# Patient Record
Sex: Female | Born: 1980 | Race: Black or African American | Hispanic: No | Marital: Single | Smoking: Former smoker
Health system: Southern US, Community
[De-identification: ages and names within clinical notes are randomized; demographics above are authoritative.]

## PROBLEM LIST (undated history)

## (undated) VITALS — BP 111/75 | HR 92 | Temp 97.6°F | Resp 16

## (undated) DIAGNOSIS — N390 Urinary tract infection, site not specified: Secondary | ICD-10-CM

## (undated) DIAGNOSIS — F419 Anxiety disorder, unspecified: Secondary | ICD-10-CM

## (undated) DIAGNOSIS — F329 Major depressive disorder, single episode, unspecified: Secondary | ICD-10-CM

## (undated) DIAGNOSIS — F209 Schizophrenia, unspecified: Secondary | ICD-10-CM

## (undated) DIAGNOSIS — F32A Depression, unspecified: Secondary | ICD-10-CM

## (undated) HISTORY — PX: TONSILLECTOMY: SUR1361

## (undated) HISTORY — PX: TUBAL LIGATION: SHX77

## (undated) HISTORY — PX: WISDOM TOOTH EXTRACTION: SHX21

---
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
## 2002-01-11 ENCOUNTER — Encounter: Payer: Self-pay | Admitting: Emergency Medicine

## 2002-01-11 ENCOUNTER — Emergency Department (HOSPITAL_COMMUNITY): Admission: EM | Admit: 2002-01-11 | Discharge: 2002-01-11 | Payer: Self-pay | Admitting: Emergency Medicine

## 2002-06-01 ENCOUNTER — Other Ambulatory Visit: Admission: RE | Admit: 2002-06-01 | Discharge: 2002-06-01 | Payer: Self-pay | Admitting: *Deleted

## 2004-12-24 ENCOUNTER — Ambulatory Visit (HOSPITAL_COMMUNITY): Admission: RE | Admit: 2004-12-24 | Discharge: 2004-12-24 | Payer: Self-pay | Admitting: Obstetrics & Gynecology

## 2005-02-20 ENCOUNTER — Inpatient Hospital Stay (HOSPITAL_COMMUNITY): Admission: AD | Admit: 2005-02-20 | Discharge: 2005-02-21 | Payer: Self-pay | Admitting: Family Medicine

## 2005-03-01 ENCOUNTER — Ambulatory Visit (HOSPITAL_COMMUNITY): Admission: RE | Admit: 2005-03-01 | Discharge: 2005-03-01 | Payer: Self-pay | Admitting: *Deleted

## 2005-04-16 ENCOUNTER — Ambulatory Visit: Payer: Self-pay | Admitting: Family Medicine

## 2005-04-16 ENCOUNTER — Inpatient Hospital Stay (HOSPITAL_COMMUNITY): Admission: AD | Admit: 2005-04-16 | Discharge: 2005-04-16 | Payer: Self-pay | Admitting: Pediatrics

## 2005-04-29 ENCOUNTER — Ambulatory Visit (HOSPITAL_COMMUNITY): Admission: RE | Admit: 2005-04-29 | Discharge: 2005-04-29 | Payer: Self-pay | Admitting: Family Medicine

## 2005-05-29 ENCOUNTER — Ambulatory Visit: Payer: Self-pay | Admitting: Family Medicine

## 2005-06-03 ENCOUNTER — Inpatient Hospital Stay (HOSPITAL_COMMUNITY): Admission: AD | Admit: 2005-06-03 | Discharge: 2005-06-03 | Payer: Self-pay | Admitting: Obstetrics & Gynecology

## 2005-06-03 ENCOUNTER — Ambulatory Visit: Payer: Self-pay | Admitting: Family Medicine

## 2005-08-03 ENCOUNTER — Ambulatory Visit: Payer: Self-pay | Admitting: *Deleted

## 2005-08-03 ENCOUNTER — Inpatient Hospital Stay (HOSPITAL_COMMUNITY): Admission: AD | Admit: 2005-08-03 | Discharge: 2005-08-03 | Payer: Self-pay | Admitting: Obstetrics and Gynecology

## 2005-08-04 ENCOUNTER — Ambulatory Visit: Payer: Self-pay | Admitting: Family Medicine

## 2005-08-04 ENCOUNTER — Inpatient Hospital Stay (HOSPITAL_COMMUNITY): Admission: AD | Admit: 2005-08-04 | Discharge: 2005-08-06 | Payer: Self-pay | Admitting: Family Medicine

## 2005-08-05 ENCOUNTER — Encounter (INDEPENDENT_AMBULATORY_CARE_PROVIDER_SITE_OTHER): Payer: Self-pay | Admitting: *Deleted

## 2005-11-18 ENCOUNTER — Emergency Department (HOSPITAL_COMMUNITY): Admission: EM | Admit: 2005-11-18 | Discharge: 2005-11-18 | Payer: Self-pay | Admitting: Family Medicine

## 2006-01-04 ENCOUNTER — Emergency Department (HOSPITAL_COMMUNITY): Admission: EM | Admit: 2006-01-04 | Discharge: 2006-01-04 | Payer: Self-pay | Admitting: Family Medicine

## 2006-04-28 ENCOUNTER — Inpatient Hospital Stay (HOSPITAL_COMMUNITY): Admission: AD | Admit: 2006-04-28 | Discharge: 2006-04-28 | Payer: Self-pay | Admitting: Obstetrics & Gynecology

## 2008-03-09 ENCOUNTER — Emergency Department (HOSPITAL_COMMUNITY): Admission: EM | Admit: 2008-03-09 | Discharge: 2008-03-10 | Payer: Self-pay | Admitting: Emergency Medicine

## 2008-09-16 ENCOUNTER — Emergency Department (HOSPITAL_COMMUNITY): Admission: EM | Admit: 2008-09-16 | Discharge: 2008-09-16 | Payer: Self-pay | Admitting: Emergency Medicine

## 2009-02-05 ENCOUNTER — Inpatient Hospital Stay (HOSPITAL_COMMUNITY): Admission: AD | Admit: 2009-02-05 | Discharge: 2009-02-05 | Payer: Self-pay | Admitting: Obstetrics & Gynecology

## 2009-02-05 ENCOUNTER — Ambulatory Visit: Payer: Self-pay | Admitting: Advanced Practice Midwife

## 2009-06-25 ENCOUNTER — Inpatient Hospital Stay (HOSPITAL_COMMUNITY): Admission: AD | Admit: 2009-06-25 | Discharge: 2009-06-25 | Payer: Self-pay | Admitting: Obstetrics & Gynecology

## 2009-12-01 ENCOUNTER — Encounter: Admission: RE | Admit: 2009-12-01 | Discharge: 2009-12-01 | Payer: Self-pay | Admitting: Internal Medicine

## 2009-12-24 ENCOUNTER — Inpatient Hospital Stay (HOSPITAL_COMMUNITY): Admission: AD | Admit: 2009-12-24 | Discharge: 2009-12-24 | Payer: Self-pay | Admitting: Obstetrics & Gynecology

## 2009-12-28 ENCOUNTER — Ambulatory Visit: Payer: Self-pay | Admitting: Oncology

## 2010-09-26 ENCOUNTER — Emergency Department (HOSPITAL_COMMUNITY)
Admission: EM | Admit: 2010-09-26 | Discharge: 2010-09-26 | Payer: Self-pay | Source: Home / Self Care | Admitting: Emergency Medicine

## 2010-11-20 LAB — CBC
HCT: 36.8 % (ref 36.0–46.0)
Hemoglobin: 12.4 g/dL (ref 12.0–15.0)
MCHC: 33.8 g/dL (ref 30.0–36.0)
MCV: 94.3 fL (ref 78.0–100.0)
Platelets: 236 10*3/uL (ref 150–400)
RBC: 3.91 MIL/uL (ref 3.87–5.11)
RDW: 12.3 % (ref 11.5–15.5)
WBC: 4.7 10*3/uL (ref 4.0–10.5)

## 2010-11-20 LAB — URINALYSIS, ROUTINE W REFLEX MICROSCOPIC
Bilirubin Urine: NEGATIVE
Glucose, UA: NEGATIVE mg/dL
Hgb urine dipstick: NEGATIVE
Ketones, ur: NEGATIVE mg/dL
Nitrite: NEGATIVE
Protein, ur: NEGATIVE mg/dL
Specific Gravity, Urine: 1.02 (ref 1.005–1.030)
Urobilinogen, UA: 0.2 mg/dL (ref 0.0–1.0)
pH: 6 (ref 5.0–8.0)

## 2010-11-20 LAB — WET PREP, GENITAL
Trich, Wet Prep: NONE SEEN
Yeast Wet Prep HPF POC: NONE SEEN

## 2010-11-20 LAB — POCT PREGNANCY, URINE: Preg Test, Ur: NEGATIVE

## 2010-11-20 LAB — GC/CHLAMYDIA PROBE AMP, GENITAL
Chlamydia, DNA Probe: NEGATIVE
GC Probe Amp, Genital: NEGATIVE

## 2010-12-06 LAB — URINE MICROSCOPIC-ADD ON

## 2010-12-06 LAB — POCT PREGNANCY, URINE: Preg Test, Ur: NEGATIVE

## 2010-12-06 LAB — URINALYSIS, ROUTINE W REFLEX MICROSCOPIC
Bilirubin Urine: NEGATIVE
Glucose, UA: NEGATIVE mg/dL
Hgb urine dipstick: NEGATIVE
Ketones, ur: NEGATIVE mg/dL
Nitrite: NEGATIVE
Protein, ur: NEGATIVE mg/dL
Specific Gravity, Urine: 1.015 (ref 1.005–1.030)
Urobilinogen, UA: 0.2 mg/dL (ref 0.0–1.0)
pH: 5 (ref 5.0–8.0)

## 2010-12-06 LAB — WET PREP, GENITAL
Trich, Wet Prep: NONE SEEN
Yeast Wet Prep HPF POC: NONE SEEN

## 2010-12-06 LAB — URIC ACID: Uric Acid, Serum: 3.1 mg/dL (ref 2.4–7.0)

## 2010-12-10 LAB — URINALYSIS, ROUTINE W REFLEX MICROSCOPIC
Bilirubin Urine: NEGATIVE
Glucose, UA: NEGATIVE mg/dL
Hgb urine dipstick: NEGATIVE
Ketones, ur: NEGATIVE mg/dL
Nitrite: NEGATIVE
Protein, ur: NEGATIVE mg/dL
Specific Gravity, Urine: >1.03 — ABNORMAL HIGH (ref 1.005–1.030)
Urobilinogen, UA: 0.2 mg/dL (ref 0.0–1.0)
pH: 6 (ref 5.0–8.0)

## 2010-12-10 LAB — WET PREP, GENITAL
Clue Cells Wet Prep HPF POC: NONE SEEN
Trich, Wet Prep: NONE SEEN
Yeast Wet Prep HPF POC: NONE SEEN

## 2010-12-10 LAB — GC/CHLAMYDIA PROBE AMP, GENITAL
Chlamydia, DNA Probe: NEGATIVE
GC Probe Amp, Genital: NEGATIVE

## 2010-12-17 LAB — POCT PREGNANCY, URINE: Preg Test, Ur: NEGATIVE

## 2010-12-17 LAB — COMPREHENSIVE METABOLIC PANEL
ALT: 18 U/L (ref 0–35)
AST: 16 U/L (ref 0–37)
Albumin: 4.2 g/dL (ref 3.5–5.2)
Alkaline Phosphatase: 34 U/L — ABNORMAL LOW (ref 39–117)
BUN: 13 mg/dL (ref 6–23)
CO2: 25 mEq/L (ref 19–32)
Calcium: 9.5 mg/dL (ref 8.4–10.5)
Chloride: 104 mEq/L (ref 96–112)
Creatinine, Ser: 0.68 mg/dL (ref 0.4–1.2)
GFR calc Af Amer: >60 mL/min (ref >60)
GFR calc non Af Amer: >60 mL/min (ref >60)
Glucose, Bld: 96 mg/dL (ref 70–99)
Potassium: 3.7 mEq/L (ref 3.5–5.1)
Sodium: 138 mEq/L (ref 135–145)
Total Bilirubin: 1.2 mg/dL (ref 0.3–1.2)
Total Protein: 7.4 g/dL (ref 6.0–8.3)

## 2010-12-17 LAB — DIFFERENTIAL
Basophils Absolute: 0 10*3/uL (ref 0.0–0.1)
Basophils Relative: 1 % (ref 0–1)
Eosinophils Absolute: 0 10*3/uL (ref 0.0–0.7)
Eosinophils Relative: 1 % (ref 0–5)
Lymphocytes Relative: 36 % (ref 12–46)
Lymphs Abs: 2.2 10*3/uL (ref 0.7–4.0)
Monocytes Absolute: 0.5 10*3/uL (ref 0.1–1.0)
Monocytes Relative: 8 % (ref 3–12)
Neutro Abs: 3.4 10*3/uL (ref 1.7–7.7)
Neutrophils Relative %: 56 % (ref 43–77)

## 2010-12-17 LAB — WET PREP, GENITAL
Trich, Wet Prep: NONE SEEN
Yeast Wet Prep HPF POC: NONE SEEN

## 2010-12-17 LAB — CBC
HCT: 37.8 % (ref 36.0–46.0)
Hemoglobin: 12.4 g/dL (ref 12.0–15.0)
MCHC: 32.8 g/dL (ref 30.0–36.0)
MCV: 95.4 fL (ref 78.0–100.0)
Platelets: 269 10*3/uL (ref 150–400)
RBC: 3.97 MIL/uL (ref 3.87–5.11)
RDW: 12.5 % (ref 11.5–15.5)
WBC: 6.1 10*3/uL (ref 4.0–10.5)

## 2010-12-17 LAB — URINALYSIS, ROUTINE W REFLEX MICROSCOPIC
Nitrite: NEGATIVE
Specific Gravity, Urine: 1.028 (ref 1.005–1.030)
pH: 7.5 (ref 5.0–8.0)

## 2010-12-17 LAB — URINE MICROSCOPIC-ADD ON

## 2010-12-17 LAB — GC/CHLAMYDIA PROBE AMP, GENITAL
Chlamydia, DNA Probe: NEGATIVE
GC Probe Amp, Genital: NEGATIVE

## 2011-01-18 NOTE — Discharge Summary (Signed)
[facility]  Patient:    Jeanette Wilson, Jeanette Wilson Visit Number: [id number] MRN: [id number]          Service Type: OBS Location: 910A [id number] Attending Physician:  Pleas Koch Dictated by:   Georgina Peer, M.D. Admit Date:  10/14/2001 Discharge Date: 10/17/2001                             Discharge Summary  ADMISSION DIAGNOSIS:          Oligohydramnios at 35-1/2 weeks.  DISCHARGE DIAGNOSES:          1. Oligohydramnios at 35-1/2 weeks.                               2. Improved amniotic fluid index.  HISTORY OF PRESENT ILLNESS:   The patient is an 30 year old primigravida with an EDC of November 22, 2001 admitted at 34-3/7 weeks because of worsening oligohydramnios.  Her infant was appropriate for gestational age and had a grade 1 placenta.  She had a normal NST and a BPP of 8/8.  However, AFI had decreased from 6.3 to 5.2.  HOSPITAL COURSE:              The patient was placed on aggressive bed rest with IV fluid hydration.  Fetal heart rate was reactive without decelerations through the hospital course.  Repeat AFI was 6.9 and a BPP of 6/8 for breathing.  Fetal heart rate strip was reactive.  Her abdomen was soft and nontender.  She had no bleeding.  Her cervix was closed and 3 cm long.  Her white blood cell count was 7.8. hemoglobin 10.0 and platelet count 228,000. PIH labs showed normal liver function tests, uric acid 2.1 and creatinine of 0.6.  Therefore, preeclampsia was not though to be an issue here and, with appropriate growth, the patient is discharged to home on bed rest with bathroom privileges, aggressive oral hydration and will return for NST and BPP on October 22, 2001.  She will follow up in the office on October 23, 2001. Dictated by:   Georgina Peer, M.D. Attending Physician:  Pleas Koch DD:  10/17/01 TD:  10/17/01 Job: [id number]

## 2011-01-18 NOTE — H&P (Signed)
[facility]  Patient:    Jeanette Wilson, Jeanette Wilson Visit Number: [id number] MRN: [id number]          Service Type: OBS Location: [id number] Attending Physician:  Pleas Koch Dictated by:   Georgina Peer, M.D. Admit Date:  10/14/2001                           History and Physical  ADMISSION DIAGNOSES:          1. Pregnancy at 34-3/7 weeks.                               2. Oligohydramnios, progressive.  HISTORY:                      This is an 30 year old gravida 1, para 0 EDC November 22, 2001 at 34-3/7 weeks who had been followed for oligohydramnios on bedrest and hydration at home with an AFI of 6.3 on February 5.  A followup AFI on February 11 showed an AFI of 5.2.  MCA Dopplers were normal.  The cervix was 3.2 cm.  The placenta was anterior with no previa and the presentation was cephalic.  Because of worsening oligohydramnios, the patient was admitted for IV fluids and bedrest.  The patient reports normal fetal movement and had a reactive NST on February 11.  The patient denies vaginal bleeding, headache, blurred vision, epigastric pain, or leg swelling.  Her prenatal care had been complicated by a placenta previa, which had been resolved at the ultrasound done on February 5 and is now anterior.  Good fetal growth by an ultrasound on February 5.  No bleeding.  The patient had been treated for anemia with iron therapy with the last hemoglobin of 9.6.  The patient was treated for a first trimester chlamydia with Zithromax with a test of cure negative.  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  The patients total weight gain so far had been 26 pounds.  There had been appropriate fundal height growth.  Laboratory values revealed a blood type of A+, antibody negative, sickle negative, VDRL nonreactive, rubella positive, HBsAg negative, HIV nonreactive, gonorrhea negative.  Chlamydia test of cure on October 31 was negative.  One-hour  Glucola was 99.  Initial platelet count was 272,000.  PAST MEDICAL HISTORY:         The patient is a Radiographer, therapeutic at [employer].  FAMILY HISTORY:               She has a family history of diabetes.  ALLERGIES:                    She has no known drug allergies.  MEDICATIONS:                  1. Prenatal vitamins.                               2. Iron.  REVIEW OF SYSTEMS:            Negative.  PHYSICAL EXAMINATION:  GENERAL:                      A well-developed African American female with blood pressure 100/60.  HEENT:  Grossly normal.  NECK:                         Thyroid not enlarged.  LUNGS:                        Clear.  HEART:                        Regular rate and rhythm.  BREASTS:                      Exam deferred.  ABDOMEN:                      Fundal height is 34 cm, gravid.  EXTREMITIES:                  Without cyanosis or edema.  Cervix was closed. Vagina with no blood or discharge.  LABORATORY DATA:              Admission CBC showed a WBC of 7.80, hemoglobin of 10.0, and a platelet count of 228,000.  PIH labs are pending.  ADMISSION DIAGNOSIS:          Oligohydramnios at 34-3/7 weeks unresponsive to conservative home management.  The patient is admitted for bed rest and IV fluids.  Reassessment of the amniotic fluid in 72+ hours as planned.  Fetal monitoring one hour each shift is planned.  All questions were answered. Dictated by:   Georgina Peer, M.D. Attending Physician:  Pleas Koch DD:  10/14/01 TD:  10/14/01 Job: [id number]

## 2011-01-18 NOTE — Op Note (Signed)
NAME:  Jeanette Wilson, Jeanette Wilson               ACCOUNT NO.:  [id number]   MEDICAL RECORD NO.:  [id number]          PATIENT TYPE:  INP   LOCATION:  9117                          FACILITY:  [facility]   PHYSICIAN:  Phil D. Okey Dupre, M.D.     DATE OF BIRTH:  12/21/2002   DATE OF PROCEDURE:  DATE OF DISCHARGE:                                 OPERATIVE REPORT   PREOPERATIVE DIAGNOSIS:  Multiparity, desires permanent sterilization.   POSTOPERATIVE DIAGNOSIS:  Multiparity, desires permanent sterilization.   OPERATION/PROCEDURE:  Postpartum tubal ligation, Pomeroy method.   SURGEON:  Javier Glazier. Okey Dupre, M.D.   ASSISTANTKaroline Caldwell B. Merlene Morse, M.D.   ANESTHESIA:  General.   COMPLICATIONS:  None.   ESTIMATED BLOOD LOSS:  Less than 20.   INDICATIONS:  The patient is a 30 year old, gravida 2, para 2-0-0-2, status  post NSVD who desires permanent sterilization.  The risks and benefits of  the procedure were discussed with the patient including risk of failure of 3-  5 per 1000 with increased risk of ectopic gestation with pregnancy  corresponding to normal uterus, tubes and ovaries.   DESCRIPTION OF PROCEDURE:  The patient was taken to the operating room.  Anesthesia was found to be adequate.  Small transverse infraumbilical skin  incision was then made with the scalpel.  Incision was carried through to  the underlying fascia until the peritoneum was identified and anterior to  the peritoneum was found to be free of any adhesions and the incision was  then extended with Metzenbaum scissors.  The patient's left fallopian tube  was identified and brought to the incision, grasped with the Babcock clamp.  Tube was then followed to the fimbria.  The Babcock clamp was then used to  grasp the tube approximately 4 cm from the cornual region.  A 3 cm segment  of tube was then ligated with a free tie of plain gut x2 and excised.  Good  hemostasis was noted.  The Bovie was used to cauterize the free ends of the  tube.  The tube  was returned to the abdomen.  The right fallopian tube was  then ligated x2 and a 3 cm segment excised in similar fashion.  Excellent  hemostasis was noted.  The tube was  returned to the abdomen.  Peritoneum and fascia were then closed in a single  layer.  Skin was closed in the subcuticular fashion.  The patient tolerated  the procedure well.  Sponge, lap and needle counts were correct x2.  The  patient was taken to the recovery room in stable condition.     ______________________________  [name] Merlene Morse, MD    ______________________________  Javier Glazier Okey Dupre, M.D.    ABC/MEDQ  D:  08/05/2005  T:  08/05/2005  Job:  [id number]

## 2011-04-14 ENCOUNTER — Inpatient Hospital Stay (HOSPITAL_COMMUNITY): Payer: Medicaid Other

## 2011-04-14 ENCOUNTER — Inpatient Hospital Stay (HOSPITAL_COMMUNITY)
Admission: AD | Admit: 2011-04-14 | Discharge: 2011-04-14 | Disposition: A | Discharge status: Home / Self Care | Payer: Medicaid Other | Source: Ambulatory Visit | Attending: Obstetrics & Gynecology | Admitting: Obstetrics & Gynecology

## 2011-04-14 ENCOUNTER — Encounter (HOSPITAL_COMMUNITY): Payer: Self-pay | Admitting: *Deleted

## 2011-04-14 DIAGNOSIS — N730 Acute parametritis and pelvic cellulitis: Secondary | ICD-10-CM

## 2011-04-14 DIAGNOSIS — B373 Candidiasis of vulva and vagina: Secondary | ICD-10-CM

## 2011-04-14 DIAGNOSIS — N739 Female pelvic inflammatory disease, unspecified: Secondary | ICD-10-CM | POA: Insufficient documentation

## 2011-04-14 DIAGNOSIS — R109 Unspecified abdominal pain: Secondary | ICD-10-CM | POA: Insufficient documentation

## 2011-04-14 DIAGNOSIS — B3731 Acute candidiasis of vulva and vagina: Secondary | ICD-10-CM | POA: Insufficient documentation

## 2011-04-14 HISTORY — DX: Urinary tract infection, site not specified: N39.0

## 2011-04-14 LAB — URINALYSIS, ROUTINE W REFLEX MICROSCOPIC
Bilirubin Urine: NEGATIVE
Hgb urine dipstick: NEGATIVE
Ketones, ur: NEGATIVE mg/dL
Protein, ur: NEGATIVE mg/dL
Urobilinogen, UA: 1 mg/dL (ref 0.0–1.0)

## 2011-04-14 LAB — WET PREP, GENITAL

## 2011-04-14 MED ORDER — METRONIDAZOLE 500 MG PO TABS: 500.0000 mg | ORAL_TABLET | Freq: Two times a day (BID) | ORAL | Status: DC

## 2011-04-14 MED ORDER — AZITHROMYCIN 1 G PO PACK
1.0000 g | PACK | Freq: Once | ORAL | Status: AC
  Administered 2011-04-14: 1 g via ORAL
  Filled 2011-04-14: qty 1

## 2011-04-14 MED ORDER — METRONIDAZOLE 500 MG PO TABS: 500.0000 mg | ORAL_TABLET | Freq: Two times a day (BID) | ORAL | Status: AC

## 2011-04-14 MED ORDER — FLUCONAZOLE 150 MG PO TABS
150.0000 mg | ORAL_TABLET | Freq: Once | ORAL | Status: AC
  Administered 2011-04-14: 150 mg via ORAL
  Filled 2011-04-14: qty 1

## 2011-04-14 MED ORDER — CEFTRIAXONE SODIUM 250 MG IJ SOLR
250.0000 mg | Freq: Once | INTRAMUSCULAR | Status: AC
  Administered 2011-04-14: 250 mg via INTRAMUSCULAR
  Filled 2011-04-14: qty 250

## 2011-04-14 MED ORDER — AZITHROMYCIN 500 MG PO TABS: 1000.0000 mg | ORAL_TABLET | Freq: Once | ORAL | Status: DC

## 2011-04-14 MED ORDER — KETOROLAC TROMETHAMINE 60 MG/2ML IM SOLN
60.0000 mg | Freq: Once | INTRAMUSCULAR | Status: AC
  Administered 2011-04-14: 60 mg via INTRAMUSCULAR
  Filled 2011-04-14: qty 2

## 2011-04-14 MED ORDER — AZITHROMYCIN 500 MG PO TABS: 1000.0000 mg | ORAL_TABLET | Freq: Once | ORAL | Status: AC

## 2011-04-14 MED ORDER — FLUCONAZOLE 150 MG PO TABS: 150.0000 mg | ORAL_TABLET | Freq: Once | ORAL | Status: AC

## 2011-04-14 NOTE — ED Notes (Signed)
[address] CNM in to discuss lab and u/s results with pt and d/c plan.

## 2011-04-14 NOTE — ED Provider Notes (Signed)
History     Chief Complaint  Patient presents with  . Abdominal Pain   HPI  Pt is here with report of increased pelvic "pressure" with urination.  Feels like "contractions".  Pain is rated a 10/10.  Denies vaginal bleeding or hematuria.  +vaginal discharge with an odor X 4 weeks.    Past Medical History  Diagnosis Date  . UTI (urinary tract infection)     Past Surgical History  Procedure Date  . Tubal ligation     No family history on file.  History  Substance Use Topics  . Smoking status: Not on file  . Smokeless tobacco: Current User  . Alcohol Use: Yes     social drinking    Allergies: No Known Allergies  Prescriptions prior to admission  Medication Sig Dispense Refill  . ibuprofen (ADVIL,MOTRIN) 200 MG tablet Take 800 mg by mouth as needed. For pain         Review of Systems  Gastrointestinal: Positive for abdominal pain.  Genitourinary: Positive for dysuria.       Greenish discharge with odor   Physical Exam   Blood pressure 105/53, pulse 80, temperature 98.8 F (37.1 C), temperature source Oral, resp. rate 20, height 5' 3.25" (1.607 m), weight 58.06 kg (128 lb), last menstrual period 03/30/2011.  Physical Exam  Constitutional: She is oriented to person, place, and time. She appears well-developed and well-nourished. She appears distressed ( uncomfortable, tearful).  HENT:  Head: Normocephalic.  Neck: Normal range of motion. Neck supple.  Cardiovascular: Normal rate, regular rhythm and normal heart sounds.   Respiratory: Effort normal and breath sounds normal.  GI: Soft. She exhibits no mass. There is tenderness ( suprapubic). There is no guarding.  Genitourinary: Cervix exhibits motion tenderness. Vaginal discharge (white, creamy; +odor) found.       Negative cervical motion tenderness  Neurological: She is alert and oriented to person, place, and time. She has normal reflexes.  Skin: Skin is warm and dry.    MAU Course  Procedures  PO Zithromax  1gm  Rocephin 250 mg IM Diflucan 150 mg PO [place] Wet Prep GC/CT  Assessment and Plan  PID Yeast Infection  Plan: DC to home Rx Flagyl BID x 14 days Zithromax 1 gm in one week Diflucan 150 mg in one week  [facility] 04/14/2011, 2:06 AM

## 2011-04-14 NOTE — ED Notes (Signed)
To us via wc.

## 2011-04-14 NOTE — ED Notes (Signed)
Pt returned from u/s via w/c. R leg sore from Rocephin inj.

## 2011-04-14 NOTE — Progress Notes (Signed)
Pt states, " I've had pain in my left lower abd for a month but it really bad tonight. I had spotting two weeks ago after my period but none since. I have a green> white vaginal discharge with an odor. When I pee I have a lot of pressure and then it gets worse. I've had pain during intercourse for a month also."

## 2011-04-14 NOTE — Progress Notes (Signed)
I have pain in my L side. Hurts when I pee and have intercourse. Pain has been goin on about a month. Ibuprofen and heat isn't helping. Everytime I pee it gets worse. Pain is sharp.

## 2011-04-16 LAB — GC/CHLAMYDIA PROBE AMP, GENITAL: GC Probe Amp, Genital: NEGATIVE

## 2011-04-17 NOTE — ED Provider Notes (Signed)
Agree with above note.  Jeanette Wilson,Jeanette Wilson 04/17/2011 9:15 AM   

## 2011-11-26 ENCOUNTER — Emergency Department (HOSPITAL_COMMUNITY): Payer: Medicaid Other

## 2011-11-26 ENCOUNTER — Other Ambulatory Visit: Payer: Self-pay

## 2011-11-26 ENCOUNTER — Emergency Department (HOSPITAL_COMMUNITY)
Admission: EM | Admit: 2011-11-26 | Discharge: 2011-11-27 | Disposition: A | Discharge status: Other Acute Inpatient Hospital | Payer: Medicaid Other | Source: Home / Self Care | Attending: Emergency Medicine | Admitting: Emergency Medicine

## 2011-11-26 ENCOUNTER — Encounter (HOSPITAL_COMMUNITY): Payer: Self-pay | Admitting: *Deleted

## 2011-11-26 DIAGNOSIS — R079 Chest pain, unspecified: Secondary | ICD-10-CM | POA: Insufficient documentation

## 2011-11-26 DIAGNOSIS — R45851 Suicidal ideations: Secondary | ICD-10-CM

## 2011-11-26 DIAGNOSIS — R55 Syncope and collapse: Secondary | ICD-10-CM | POA: Insufficient documentation

## 2011-11-26 DIAGNOSIS — F329 Major depressive disorder, single episode, unspecified: Secondary | ICD-10-CM

## 2011-11-26 DIAGNOSIS — R05 Cough: Secondary | ICD-10-CM | POA: Insufficient documentation

## 2011-11-26 DIAGNOSIS — F419 Anxiety disorder, unspecified: Secondary | ICD-10-CM

## 2011-11-26 DIAGNOSIS — F341 Dysthymic disorder: Secondary | ICD-10-CM | POA: Insufficient documentation

## 2011-11-26 DIAGNOSIS — R059 Cough, unspecified: Secondary | ICD-10-CM | POA: Insufficient documentation

## 2011-11-26 DIAGNOSIS — R0602 Shortness of breath: Secondary | ICD-10-CM | POA: Insufficient documentation

## 2011-11-26 DIAGNOSIS — R42 Dizziness and giddiness: Secondary | ICD-10-CM | POA: Insufficient documentation

## 2011-11-26 DIAGNOSIS — R070 Pain in throat: Secondary | ICD-10-CM | POA: Insufficient documentation

## 2011-11-26 HISTORY — DX: Depression, unspecified: F32.A

## 2011-11-26 HISTORY — DX: Major depressive disorder, single episode, unspecified: F32.9

## 2011-11-26 LAB — URINALYSIS, ROUTINE W REFLEX MICROSCOPIC
Bilirubin Urine: NEGATIVE
Ketones, ur: NEGATIVE mg/dL
Nitrite: NEGATIVE
Protein, ur: NEGATIVE mg/dL
Specific Gravity, Urine: 1.005 (ref 1.005–1.030)
Urobilinogen, UA: 0.2 mg/dL (ref 0.0–1.0)

## 2011-11-26 LAB — DIFFERENTIAL
Basophils Relative: 0 % (ref 0–1)
Lymphocytes Relative: 27 % (ref 12–46)
Lymphs Abs: 1.5 10*3/uL (ref 0.7–4.0)
Monocytes Relative: 10 % (ref 3–12)
Neutro Abs: 3.5 10*3/uL (ref 1.7–7.7)
Neutrophils Relative %: 62 % (ref 43–77)

## 2011-11-26 LAB — CBC
Hemoglobin: 13 g/dL (ref 12.0–15.0)
RBC: 4.32 MIL/uL (ref 3.87–5.11)
WBC: 5.7 10*3/uL (ref 4.0–10.5)

## 2011-11-26 LAB — COMPREHENSIVE METABOLIC PANEL
Albumin: 4.4 g/dL (ref 3.5–5.2)
Alkaline Phosphatase: 39 U/L (ref 39–117)
BUN: 8 mg/dL (ref 6–23)
Chloride: 101 mEq/L (ref 96–112)
Glucose, Bld: 109 mg/dL — ABNORMAL HIGH (ref 70–99)
Potassium: 3.9 mEq/L (ref 3.5–5.1)
Total Bilirubin: 0.6 mg/dL (ref 0.3–1.2)

## 2011-11-26 LAB — RAPID URINE DRUG SCREEN, HOSP PERFORMED
Amphetamines: NOT DETECTED
Tetrahydrocannabinol: POSITIVE — AB

## 2011-11-26 LAB — URINE MICROSCOPIC-ADD ON

## 2011-11-26 LAB — SALICYLATE LEVEL: Salicylate Lvl: <2 mg/dL — ABNORMAL LOW (ref 2.8–20.0)

## 2011-11-26 LAB — ETHANOL: Alcohol, Ethyl (B): <11 mg/dL (ref 0–11)

## 2011-11-26 LAB — ACETAMINOPHEN LEVEL: Acetaminophen (Tylenol), Serum: <15 ug/mL (ref 10–30)

## 2011-11-26 MED ORDER — ZOLPIDEM TARTRATE 5 MG PO TABS: 5.0000 mg | ORAL_TABLET | Freq: Every evening | ORAL | Status: DC | PRN

## 2011-11-26 MED ORDER — LORAZEPAM 1 MG PO TABS: 1.0000 mg | ORAL_TABLET | Freq: Three times a day (TID) | ORAL | Status: DC | PRN

## 2011-11-26 MED ORDER — IBUPROFEN 600 MG PO TABS: 600.0000 mg | ORAL_TABLET | Freq: Three times a day (TID) | ORAL | Status: DC | PRN

## 2011-11-26 MED ORDER — ONDANSETRON HCL 4 MG PO TABS: 4.0000 mg | ORAL_TABLET | Freq: Three times a day (TID) | ORAL | Status: DC | PRN

## 2011-11-26 MED ORDER — ACETAMINOPHEN 325 MG PO TABS
650.0000 mg | ORAL_TABLET | ORAL | Status: DC | PRN
  Administered 2011-11-26: 650 mg via ORAL
  Filled 2011-11-26: qty 2

## 2011-11-26 NOTE — ED Notes (Signed)
Care assumed

## 2011-11-26 NOTE — ED Notes (Signed)
Act in with pt

## 2011-11-26 NOTE — ED Notes (Signed)
Pt reports feeling a lot of stress.  Felt dizzy while at work today. Pt verbalizes SI-reports that she is having a hard time taking care of her kids.  Pt crying.  Pt is calm and cooperative.

## 2011-11-26 NOTE — ED Notes (Signed)
Pt states "I've been coughing x 1 wk, just cough up mucus, I was @ work, felt a little hot & then got dizzy"

## 2011-11-26 NOTE — ED Notes (Signed)
Family at bedside. 

## 2011-11-26 NOTE — ED Notes (Signed)
Pt states that she has been depressed with thoughts of suicide for six years.  Has had plans of overdose, driving car off bridge, cutting wrists.  Denies HI.  Tearful during assessment with poor eye contact.

## 2011-11-26 NOTE — ED Notes (Signed)
2 belongings bags locked in locker # 34

## 2011-11-26 NOTE — BH Assessment (Signed)
Assessment Note   Jeanette Wilson is a 31 y.o. female who presents to [facility] with SI/Depression w/detailed plan to harm self---ingest sleeping pills, drive car off bridge or alcohol poisoning.  Pt describes plan: "I would give my children a bath, dress them and give them breakfast then send them off to school and then take the sleeping pills."  Pt reports she has been battling depression for 2 yrs since the death of her brother(died from heart condition 3 yrs ago).  Pt has not received any grief counseling or any inpt/oupt mental health tx b/c she's afraid she'd lost her children(ages 6 & 10).  Pt is tearful during assessment, says she's stressed and is having difficulty performing at work (fainted today, initially why she came to ED) and caring for her children(father not in the picture).  She is also having housing issues(currently living w/sister and boyfriend), financial struggles.  Pt is not actively psychotic, but says she sees shadows/spots and hears voices occasionally.  Pt uses alcohol(drinks 1/5 tequila on wknds onl) and uses THC(1 joint, 2x's wkly).  Pt..'s children are currently with grandmother, says mom is not mentally/physically capable of helping pt take care of her children.  "I feel like a failure as a mother."  Pt pending telepsych consult.   Axis I: Major Depression, Recurrent severe Axis II: Deferred Axis III:  Past Medical History  Diagnosis Date  . UTI (urinary tract infection)   . Depression    Axis IV: economic problems, housing problems, other psychosocial or environmental problems, problems related to social environment and problems with primary support group Axis V: 31-40 impairment in reality testing  Past Medical History:  Past Medical History  Diagnosis Date  . UTI (urinary tract infection)   . Depression     Past Surgical History  Procedure Date  . Tubal ligation     Family History: No family history on file.  Social History:  reports that she has been  passively smoking.  She uses smokeless tobacco. She reports that she drinks alcohol. She reports that she uses illicit drugs (Marijuana) about once per week.  Additional Social History:  Alcohol / Drug Use Pain Medications: None  Prescriptions: None  Over the Counter: None  History of alcohol / drug use?: Yes Longest period of sobriety (when/how long): None  Withdrawal Symptoms: Other (Comment) (No w/d sxs at this time ) Substance #1 Name of Substance 1: Alcohol--Terquila  1 - Age of First Use: 20's  1 - Amount (size/oz): 1/5  1 - Frequency: Wknds Only  1 - Duration: On-going  1 - Last Use / Amount: Last Wknd  Substance #2 Name of Substance 2: THC  2 - Age of First Use: 20'S  2 - Amount (size/oz): 1 Joint  2 - Frequency: 2x's Wkly  2 - Duration: On-going  2 - Last Use / Amount: Unk  Allergies: No Known Allergies  Home Medications:  Medications Prior to Admission  Medication Dose Route Frequency Provider Last Rate Last Dose  . acetaminophen (TYLENOL) tablet 650 mg  650 mg Oral Q4H PRN Rise Patience, PA      . ibuprofen (ADVIL,MOTRIN) tablet 600 mg  600 mg Oral Q8H PRN Rise Patience, PA      . LORazepam (ATIVAN) tablet 1 mg  1 mg Oral Q8H PRN Rise Patience, PA      . ondansetron [facility]) tablet 4 mg  4 mg Oral Q8H PRN Rise Patience, PA      .  zolpidem (AMBIEN) tablet 5 mg  5 mg Oral QHS PRN Rise Patience, PA       Medications Prior to Admission  Medication Sig Dispense Refill  . ibuprofen (ADVIL,MOTRIN) 200 MG tablet Take 1,200 mg by mouth every 8 (eight) hours as needed. For pain        OB/GYN Status:  Patient's last menstrual period was 10/25/2011.  General Assessment Data Location of Assessment: [facility] ED Living Arrangements: Family members (Lives w/younger sister ) Can pt return to current living arrangement?: Yes Admission Status: Voluntary Is patient capable of signing voluntary admission?: Yes Transfer from: Acute Hospital Referral Source: MD  Education Status Is  patient currently in school?: No Current Grade: None  Highest grade of school patient has completed: Unk  Name of school: Unk  Contact person: None   Risk to self Suicidal Ideation: Yes-Currently Present Suicidal Intent: Yes-Currently Present Is patient at risk for suicide?: Yes Suicidal Plan?: Yes-Currently Present Specify Current Suicidal Plan: Ingest sleeping pills, Alcohol poisoning, Drive car off bridge Access to Means: Yes Specify Access to Suicidal Means: Pills, Alcohol, Vehicle  What has been your use of drugs/alcohol within the last 12 months?: Pt abusing Alcohol, THC  Previous Attempts/Gestures: No How many times?: 0  Other Self Harm Risks: None  Triggers for Past Attempts: None known Intentional Self Injurious Behavior: None Family Suicide History: No Recent stressful life event(s): Loss (Comment);Financial Problems;Other (Comment) (Pt.'s brother died 3 yrs ago; No support ) Persecutory voices/beliefs?: No Depression: Yes Depression Symptoms: Insomnia;Tearfulness;Fatigue;Isolating;Loss of interest in usual pleasures;Feeling worthless/self pity;Feeling angry/irritable Substance abuse history and/or treatment for substance abuse?: Yes Suicide prevention information given to non-admitted patients: Not applicable  Risk to Others Homicidal Ideation: No Thoughts of Harm to Others: No Current Homicidal Intent: No Current Homicidal Plan: No Access to Homicidal Means: No Identified Victim: None  History of harm to others?: No Assessment of Violence: None Noted Violent Behavior Description: None  Does patient have access to weapons?: No Criminal Charges Pending?: No Does patient have a court date: No  Psychosis Hallucinations: Auditory;Visual (Pt not actively psychotic; sometimes sees shadows, hears voi) Delusions: None noted  Mental Status Report Appear/Hygiene: Other (Comment) (Appropriate ) Eye Contact: Good Motor Activity: Unremarkable Speech:  Logical/coherent;Soft Level of Consciousness: Alert Mood: Depressed;Sad;Anhedonia;Despair;Empty Affect: Depressed;Sad;Appropriate to circumstance Anxiety Level: None Thought Processes: Coherent;Relevant Judgement: Impaired Orientation: Person;Place;Time;Situation Obsessive Compulsive Thoughts/Behaviors: None  Cognitive Functioning Concentration: Normal Memory: Recent Intact;Remote Intact IQ: Average Insight: Poor Impulse Control: Poor Appetite: Poor Weight Loss: 6  Weight Gain: 0  Sleep: Decreased Total Hours of Sleep: 4  (Reports 4 hrs wkly sleep ) Vegetative Symptoms: None  Prior Inpatient Therapy Prior Inpatient Therapy: No Prior Therapy Dates: None  Prior Therapy Facilty/Provider(s): None  Reason for Treatment: None   Prior Outpatient Therapy Prior Outpatient Therapy: No Prior Therapy Dates: None  Prior Therapy Facilty/Provider(s): None  Reason for Treatment: None   ADL Screening (condition at time of admission) Patient's cognitive ability adequate to safely complete daily activities?: Yes Patient able to express need for assistance with ADLs?: Yes Independently performs ADLs?: Yes Weakness of Legs: None Weakness of Arms/Hands: None       Abuse/Neglect Assessment (Assessment to be complete while patient is alone) Physical Abuse: Denies Verbal Abuse: Denies Sexual Abuse: Denies Exploitation of patient/patient's resources: Denies Self-Neglect: Denies Values / Beliefs Cultural Requests During Hospitalization: None Spiritual Requests During Hospitalization: None Consults Spiritual Care Consult Needed: No Social Work Consult Needed: No Merchant navy officer (For Healthcare) Advance Directive:  Patient does not have advance directive;Patient would not like information Pre-existing out of facility DNR order (yellow form or pink MOST form): No    Additional Information 1:1 In Past 12 Months?: No CIRT Risk: No Elopement Risk: No Does patient have medical  clearance?: Yes     Disposition:  Disposition Disposition of Patient: Referred to (Telepsych ) Patient referred to: Other (Comment) (Telepsych )  On Site Evaluation by:   Reviewed with Physician:     Murrell Redden 11/26/2011 9:06 PM

## 2011-11-26 NOTE — ED Notes (Signed)
Tele Psych completed

## 2011-11-26 NOTE — ED Provider Notes (Signed)
History     CSN: [id number]  Arrival date & time 11/26/11  1143   First MD Initiated Contact with Patient 11/26/11 1229      Chief Complaint  Patient presents with  . Cough  . Near Syncope    (Consider location/radiation/quality/duration/timing/severity/associated sxs/prior treatment) HPI Comments: Patient reports she has been sick for several days with sore throat and cough productive of sputum with mild shortness of breath and chest pain.  Patient states the chest pain is a burning sensation in the center of her chest.  It is exacerbated by cold air and deep breathing and is improved with steam from a warm shower.  States that today she wasn't feeling well and got lightheaded, had a syncopal episode at work.  States that she has had syncopal episodes in the past when she is having anxiety attacks.  States that her stress and anxiety have been uncontrolled for awhile, she is very depressed, and she is having suicidal thoughts with a plan to take all of her pills (iron and sleeping pills).  Denies fevers.  Mother reports patient's brother died 3 years ago and this is a difficult time of year because it is almost his birthday.    Patient is a 31 y.o. female presenting with cough. The history is provided by the patient and a parent.  Cough Associated symptoms include sore throat.    Past Medical History  Diagnosis Date  . UTI (urinary tract infection)     Past Surgical History  Procedure Date  . Tubal ligation     No family history on file.  History  Substance Use Topics  . Smoking status: Passive Smoker -- 0.0 packs/day  . Smokeless tobacco: Current User  . Alcohol Use: Yes     social drinking    OB History    Grav Para Term Preterm Abortions TAB SAB Ect Mult Living   2 2 2  0 0 0 0 0 0 2      Review of Systems  HENT: Positive for sore throat. Negative for trouble swallowing.   Respiratory: Positive for cough.   Gastrointestinal: Negative for vomiting and diarrhea.    Genitourinary: Negative for dysuria, urgency, frequency, vaginal bleeding and vaginal discharge.  All other systems reviewed and are negative.    Allergies  Review of patient's allergies indicates no known allergies.  Home Medications   Current Outpatient Rx  Name Route Sig Dispense Refill  . IBUPROFEN 200 MG PO TABS Oral Take 1,200 mg by mouth every 8 (eight) hours as needed. For pain    . OVER THE COUNTER MEDICATION Oral Take 1 tablet by mouth every 30 (thirty) days. Iron Slow Release 45mg .      BP 115/62  Pulse 92  Temp(Src) 99.3 F (37.4 C) (Oral)  Resp 16  Wt 129 lb (58.514 kg)  SpO2 98%  LMP 10/25/2011  Physical Exam  Nursing note and vitals reviewed. Constitutional: She is oriented to person, place, and time. She appears well-developed and well-nourished.  HENT:  Head: Normocephalic and atraumatic.  Neck: Neck supple.  Cardiovascular: Normal rate, regular rhythm and normal heart sounds.   Pulmonary/Chest: Breath sounds normal. No respiratory distress. She has no wheezes. She has no rales. She exhibits no tenderness.  Abdominal: Soft. Bowel sounds are normal. She exhibits no distension. There is no tenderness. There is no rebound and no guarding.  Neurological: She is alert and oriented to person, place, and time.  Psychiatric: Her speech is normal and behavior is  normal. Judgment normal. Her mood appears anxious. Cognition and memory are normal. She exhibits a depressed mood. She expresses suicidal ideation. She expresses suicidal plans.    ED Course  Procedures (including critical care time)  Labs Reviewed  COMPREHENSIVE METABOLIC PANEL - Abnormal; Notable for the following:    Glucose, Bld 109 (*)    All other components within normal limits  SALICYLATE LEVEL - Abnormal; Notable for the following:    Salicylate Lvl <2.0 (*)    All other components within normal limits  CBC  DIFFERENTIAL  ETHANOL  ACETAMINOPHEN LEVEL  URINE RAPID DRUG SCREEN (HOSP  PERFORMED)  URINALYSIS, ROUTINE W REFLEX MICROSCOPIC  PREGNANCY, URINE   Dg Chest 2 View  11/26/2011  *RADIOLOGY REPORT*  Clinical Data: Cough, syncope, chest pain.  CHEST - 2 VIEW  Comparison: None  Findings: Heart and mediastinal contours are within normal limits. No focal opacities or effusions.  No acute bony abnormality.  IMPRESSION: No active cardiopulmonary disease.  Original Report Authenticated By: Cyndie Chime, M.D.    Date: 11/26/2011  Rate: 76  Rhythm: normal sinus rhythm  QRS Axis: normal  Intervals: normal  ST/T Wave abnormalities: normal  Conduction Disutrbances: none  Narrative Interpretation:   Old EKG Reviewed: No old EKG available    3:06 PM I spoke with ACT, who will see patient.   1. Anxiety   2. Syncope   3. Depression   4. Suicidal ideation       MDM  Patient with history of depression, off her medication x 3 months, now with increasing stress and anxiety.  Has hx syncope with anxiety attacks and likely had one today.  Reporting SI with plan to overdose on her medications.  Workup is unremarkable.  ACT to see and evaluate patient for admission.      Patient signed out to Dr Radford Pax.  UA, urine pregnancy are pending.  ACT is aware of the patient and will see for placement.         Dillard Cannon [place], [place] 11/26/11 [phone]

## 2011-11-26 NOTE — ED Notes (Signed)
Pt. Belongings are locked in locker 34 and she has 2 belonging bags.

## 2011-11-27 ENCOUNTER — Inpatient Hospital Stay (HOSPITAL_COMMUNITY)
Admission: EM | Admit: 2011-11-27 | Discharge: 2011-11-29 | DRG: 885 | Disposition: A | Discharge status: Home / Self Care | Payer: Medicaid Other | Source: Ambulatory Visit | Attending: Psychiatry | Admitting: Psychiatry

## 2011-11-27 ENCOUNTER — Encounter (HOSPITAL_COMMUNITY): Payer: Self-pay | Admitting: *Deleted

## 2011-11-27 DIAGNOSIS — F411 Generalized anxiety disorder: Secondary | ICD-10-CM | POA: Diagnosis present

## 2011-11-27 DIAGNOSIS — Z79899 Other long term (current) drug therapy: Secondary | ICD-10-CM

## 2011-11-27 DIAGNOSIS — R45851 Suicidal ideations: Secondary | ICD-10-CM

## 2011-11-27 DIAGNOSIS — F333 Major depressive disorder, recurrent, severe with psychotic symptoms: Principal | ICD-10-CM | POA: Diagnosis present

## 2011-11-27 DIAGNOSIS — F329 Major depressive disorder, single episode, unspecified: Secondary | ICD-10-CM

## 2011-11-27 MED ORDER — CIPROFLOXACIN HCL 250 MG PO TABS
250.0000 mg | ORAL_TABLET | Freq: Two times a day (BID) | ORAL | Status: DC
  Administered 2011-11-27 – 2011-11-29 (×5): 250 mg via ORAL
  Filled 2011-11-27 (×8): qty 1

## 2011-11-27 MED ORDER — CITALOPRAM HYDROBROMIDE 20 MG PO TABS
20.0000 mg | ORAL_TABLET | Freq: Every day | ORAL | Status: DC
  Administered 2011-11-27 – 2011-11-29 (×3): 20 mg via ORAL
  Filled 2011-11-27 (×3): qty 1
  Filled 2011-11-27: qty 7
  Filled 2011-11-27 (×2): qty 1

## 2011-11-27 MED ORDER — QUETIAPINE FUMARATE 200 MG PO TABS
200.0000 mg | ORAL_TABLET | Freq: Every evening | ORAL | Status: DC | PRN
  Administered 2011-11-27: 200 mg via ORAL
  Filled 2011-11-27 (×7): qty 1

## 2011-11-27 MED ORDER — QUETIAPINE FUMARATE 200 MG PO TABS: 200.0000 mg | ORAL_TABLET | Freq: Every evening | ORAL | Status: DC | PRN

## 2011-11-27 MED ORDER — OLANZAPINE 10 MG PO TABS
10.0000 mg | ORAL_TABLET | Freq: Every day | ORAL | Status: DC
  Filled 2011-11-27 (×2): qty 1

## 2011-11-27 MED ORDER — CITALOPRAM HYDROBROMIDE 20 MG PO TABS
20.0000 mg | ORAL_TABLET | Freq: Every day | ORAL | Status: DC
  Filled 2011-11-27: qty 1

## 2011-11-27 NOTE — ED Provider Notes (Signed)
I spoke with Dr.Horvath , Psychiatry. Is recommending that patient be admitted. Is also recommending that Celexa, 20 mg daily be started.  Raeford Razor, MD 11/27/11 0001

## 2011-11-27 NOTE — Tx Team (Signed)
Initial Interdisciplinary Treatment Plan  PATIENT STRENGTHS: (choose at least two) Ability for insight Active sense of humor Average or above average intelligence Capable of independent living Communication skills General fund of knowledge Motivation for treatment/growth Physical Health Work skills  PATIENT STRESSORS: Financial difficulties Loss of brother* Medication change or noncompliance Occupational concerns Substance abuse Traumatic event   PROBLEM LIST: Problem List/Patient Goals Date to be addressed Date deferred Reason deferred Estimated date of resolution  "I wanna be stable enough for my girls" 11/27/11           "Wants to be mental stable" 11/27/11           Depression 11/27/11     Increased risk for suicide 11/27/11     Substance abuse 11/27/11                  DISCHARGE CRITERIA:  Ability to meet basic life and health needs Adequate post-discharge living arrangements Improved stabilization in mood, thinking, and/or behavior Medical problems require only outpatient monitoring Motivation to continue treatment in a less acute level of care Need for constant or close observation no longer present Reduction of life-threatening or endangering symptoms to within safe limits Safe-care adequate arrangements made Verbal commitment to aftercare and medication compliance Withdrawal symptoms are absent or subacute and managed without 24-hour nursing intervention  PRELIMINARY DISCHARGE PLAN: Attend aftercare/continuing care group Attend 12-step recovery group Outpatient therapy Participate in family therapy Return to previous living arrangement Return to previous work or school arrangements  PATIENT/FAMIILY INVOLVEMENT: This treatment plan has been presented to and reviewed with the patient, Jeanette Wilson, and/or family member.  The patient and family have been given the opportunity to ask questions and make suggestions.  Jeanette Wilson 11/27/2011, 5:58  AM

## 2011-11-27 NOTE — Progress Notes (Signed)
  Patient ID: Jeanette Wilson, female   DOB: 08-10-1981, 31 y.o.   MRN: [id number]  Patient pleasant on approach this am. States that she has been depressed recently. States stressors include not being able to provide like she wants to for her children. Reports continued depression aver the death of her brother 2 years ago. Reports has not been sleeping at night and wants to sleep better. Reported hearing voices to NP. Started on celexa and antibiotic today. Staff will monitor and encourage group attendance.

## 2011-11-27 NOTE — Progress Notes (Signed)
Patient ID: Jeanette Wilson, female   DOB: 13-Aug-1981, 31 y.o.   MRN: [id number] Patient was pleasant and cooperative, but depressed and tearful during the assessment. States that she's had feelings of depression and suicidal thoughts for a while, but was afraid to get help because she didn't want anyone to take her kids. "They're all I have."  Pt reports she drinks a 5th of ETOH weekly and smoke THC bid weekly.  Pt became tearful especially when discussing her brother, whom died 11-Nov-2010 and whose birthday would have been the end of March. Pt states she has no support system, because her mother and sister are also grieving the lost of her brother, and cant be there emotionally to help her. States she has a problem with insomnia and hasn't slept in 5 days. Stated that in 08/2011 she had a Rx for "depression and sleeping pills", but didn't take them. Pt and her 2 kids ages 50 and 102, live with her sister and her sister's boyfriend.  Pt denied SI on adm, and contracts for safety. Support and encouragement was offered.

## 2011-11-27 NOTE — ED Provider Notes (Signed)
  Physical Exam  BP 116/81  Pulse 79  Temp(Src) 98.6 F (37 C) (Oral)  Resp 16  Wt 129 lb (58.514 kg)  SpO2 98%  LMP 10/25/2011  Physical Exam  ED Course  Procedures  MDM Pt has been accepted at [facility] by Dr. Dan Humphreys.  Transfer completed.      Vida Roller, MD 11/27/11 [phone]
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
## 2011-11-27 NOTE — Progress Notes (Signed)
Pt attended discharge planning group and actively participated.  Pt presents with flat affect and depressed mood.  Pt was tearful when sharing reason for entering the hospital.  Pt states that she hasn't slept since Sunday and has insomnia for the past 2.5 years.  Pt states that she has been depressed since the death of her brother.  Pt states that she has anxiety and had an anxiety attack at work.  Pt states that she works at [employer] on [address] for the past 1.5 years.  Pt states that she passed out at work and woke up with people around her and was taken by the ambulance.  Pt states that she said she was feeling suicidal.  Pt states that she's never received help for mental health because she is afraid her kids will be taken from her.  Pt states her children are with her mother and grandmother while she is here.  SW assured pt that if her kids are taken care of it's okay to get help for herself.  Pt states that she feels guilty for not being with her children now.  Pt states that she is concerned about having anxiety at work again.  Pt states that she lives with her sister and her sister's boyfriend in [place].  Pt will return home and has transportation home.  Pt is open to a referral to a psychiatrist and therapy.  Pt ranks depression at a 7 and denies anxiety and SI today.  SW will assess for appropriate referrals.  No further needs voiced by pt at this time.    Jeanette Wilson, LCSWA 11/27/2011  10:57 AM

## 2011-11-27 NOTE — Progress Notes (Signed)
[facility] Group Notes:  (Counselor/Nursing/MHT/Case Management/Adjunct)  11/27/2011 11:54 AM  Type of Therapy:  Group Therapy  Participation Level:  Did Not Attend  Jeanette Wilson 11/27/2011, 11:54 AM
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
## 2011-11-27 NOTE — H&P (Signed)
Medical/psychiatric screening examination/treatment/procedure(s) were performed by non-physician practitioner and as supervising physician I was immediately available for consultation/collaboration.   I have seen and examined this patient and agree with this evaluation.  

## 2011-11-27 NOTE — ED Provider Notes (Signed)
Medical screening examination/treatment/procedure(s) were performed by non-physician practitioner and as supervising physician I was immediately available for consultation/collaboration.   Laray Anger, DO 11/27/11 [phone]

## 2011-11-27 NOTE — H&P (Signed)
Psychiatric Admission Assessment Adult  Patient Identification:  Jeanette Wilson  Date of Evaluation:  11/27/2011  Chief Complaint:  MDD  History of Present Illness:: This is a 31 year old African-American female, admitted to [facility] from the [facility] ED with complaints of suicidal thoughts. Patient reports, "I had an anxiety attack while at work yesterday.  When it started, I felt my heart racing, my hands getting numb and I got very dizzy then. I believe it is due to stress and depression. I am a single mother of 2 girls, trying to financially care for them is tough. I have been depressed x 6 years. My depression started when I learnt that I will be raising and caring for my daughters alone. Then around that time, my 43 year old brother dying at the age of 36 due to brain tumor and heart problems did not help my depression either. I miss him a lot and I still have not dealt with his death. My mother and my siblings are all depressed. We are not actually helping each other cope because every body is at different stages of the grieving process. I have never attempted suicicde before and I have not and did not try to kill myself. But I had a thought about overdosing on my sleeping medications. I hear voices sometimes. It has been going on for a long time. These were group of voices conversing with me. I hear and talk to my brother since his passing. I see things as well, they are like shadows. I had seen a psychiatrist here in [place] a long time ago. He prescribed me an antidepressant and a sleeping pill. I can't remember the name of the antidepressant.  He told me that it will take about 1 month for me to notice that it works. However, I took that medicine for only 3 days and stopped. I still take the sleeping at night".  Mood Symptoms:  Hopelessness, Sadness, SI,  Depression Symptoms:  depressed mood, insomnia, feelings of worthlessness/guilt, suicidal thoughts with specific  plan, panic attacks,  (Hypo) Manic Symptoms:  Irritable Mood,  Anxiety Symptoms:  Excessive Worry, Panic Symptoms,  Psychotic Symptoms:  Hallucinations: Auditory Visual  PTSD Symptoms: Had a traumatic exposure:  "other than the death of my brother, I have no specific traumatic event in my life"  Past Psychiatric History: Diagnosis: Major depressive disorder with psychotic features.  Hospitalizations: [facility]  Outpatient Care: "I saw a psychiatric here in [place], I just do not remember his name or the Clinic name"  Substance Abuse Care: Patient denies  Self-Mutilation: Patient denies  Suicidal Attempts: Patient denies attempts, admits thoughts.  Violent Behaviors: Patient denies.   Past Medical History:   Past Medical History  Diagnosis Date  . UTI (urinary tract infection) tubiligation  . Depression    None.   Allergies:  No Known Allergies   PTA Medications: Prescriptions prior to admission  Medication Sig Dispense Refill  . ibuprofen (ADVIL,MOTRIN) 200 MG tablet Take 1,200 mg by mouth every 8 (eight) hours as needed. For pain      . OVER THE COUNTER MEDICATION Take 1 tablet by mouth every 30 (thirty) days. Iron Slow Release 45mg .        Previous Psychotropic Medications:  Medication/Dose  Ambien 5 mg Q bedtime  Citalopram daily (I only used for 3 days, then stopped)             Substance Abuse History in the last 12 months: Substance Age  of 1st Use Last Use Amount Specific Type  Nicotine 17 "I don't remember. I only smoke may be 1 cigarette a month" 3 Cigarettes.  Alcohol 17 Last weekend 4 shots Tequila  Cannabis 23 Prior to hosp 1 joint twice a week Marijuana  Opiates Denies use     Cocaine Denies use     Methamphetamines Denies use     LSD Denies use     Ecstasy Denies use     Benzodiazepines Denies use     Caffeine      Inhalants      Others:                         Consequences of Substance Abuse: Medical Consequences:  Liver damage,  Possible death by overdose Legal Consequences:  Arrests, jail time, loss of driving privileges. Family Consequences:  Family discord  Social History: Current Place of Residence: [place], [place]   Place of Birth: [place]   Family Members: "My 2 girls"  Marital Status:  Single  Children: 2  Sons: 0  Daughters: 2  Relationships: "With my family"  Education:  HS Graduate  Educational Problems/Performance: None reported  Religious Beliefs/Practices: None reported  History of Abuse (Emotional/Phsycial/Sexual): None reported  Occupational Experiences: Designer, jewellery.  Military History:  None.  Legal History: None reported  Hobbies/Interests: None reported  Family History:  Depression: Mother  Mental Status Examination/Evaluation: Objective:  Appearance: Casual and Neat  Eye Contact::  Good  Speech:  Clear and Coherent  Volume:  Normal  Mood:  Depressed  Affect:  Flat and Tearful  Thought Process:  Coherent and Intact  Orientation:  Full  Thought Content:  Hallucinations: Auditory Visual  Suicidal Thoughts:  Yes.  with intent/plan  Homicidal Thoughts:  No  Memory:  Immediate;   Good Recent;   Good Remote;   Good  Judgement:  Poor  Insight:  Fair  Psychomotor Activity:  Normal  Concentration:  Good  Recall:  Good  Akathisia:  No  Handed:  Right  AIMS (if indicated):     Assets:  Desire for Improvement  Sleep:       Laboratory/X-Ray: none Psychological Evaluation(s)      Assessment:    AXIS I:  Major depressive disorder with psychotic features. AXIS II:  Deferred AXIS III:   Past Medical History  Diagnosis Date  . UTI (urinary tract infection) tubiligation  . Depression    AXIS IV:  economic problems, occupational problems, other psychosocial or environmental problems and problems related to social environment AXIS V:  41-50 serious symptoms  Treatment Plan/Recommendations: Admit for safety and stabilization.                                                                 Review and reinstate any pertinent home medications.                                                                Cipro 250 mg bid x 3 days for uti.  Citalopram 20 mg daily.                                                                  Treatment Plan Summary: Daily contact with patient to assess and evaluate symptoms and progress in treatment Medication management   Current Medications:  No current facility-administered medications for this encounter.   Facility-Administered Medications Ordered in Other Encounters  Medication Dose Route Frequency Provider Last Rate Last Dose  . DISCONTD: acetaminophen (TYLENOL) tablet 650 mg  650 mg Oral Q4H PRN Rise Patience, PA   650 mg at 11/26/11 2200  . DISCONTD: citalopram (CELEXA) tablet 20 mg  20 mg Oral Daily Raeford Razor, MD      . DISCONTD: ibuprofen (ADVIL,MOTRIN) tablet 600 mg  600 mg Oral Q8H PRN Rise Patience, PA      . DISCONTD: LORazepam (ATIVAN) tablet 1 mg  1 mg Oral Q8H PRN Rise Patience, PA      . DISCONTD: ondansetron (ZOFRAN) tablet 4 mg  4 mg Oral Q8H PRN Rise Patience, PA      . DISCONTD: zolpidem (AMBIEN) tablet 5 mg  5 mg Oral QHS PRN Rise Patience, PA        Observation Level/Precautions:  Q 15 minutes checks for safety  Laboratory:  Reviewed and noted ED Lab findings on file.  Psychotherapy: Group  Medications: See medication lists  Routine PRN Medications:  No  Consultations:  None indicated at this time.  Discharge Concerns:  Safety  Other:     Sanjuana Kava 3/27/201310:44 AM

## 2011-11-27 NOTE — Progress Notes (Signed)
Patient ID: Jeanette Wilson, female   DOB: 1980/12/18, 31 y.o.   MRN: [id number] Pt. Reports depression at "1". Denies SHI. Pt. Talks about grief of losing brother in 2010, how the entire family seem to still be dealing with unable to have large family functions due to this. Pt. Also talks about how she has been the one for years to help mom and take care of family when mom worked and tired to assist her with taking care of the sick brother. Pt. Says she seem to be the more stable one. She has helped care for the family and is a singer parent of two daughters 59, and 61 years of age. Pt. Now having problems sleeping says she can go up to four days without sleeping. Pt. Says trying to care for everyone has taken a toll on her. Writer encouraged pt. To let the adults take care of themselves and she is to focus on herself and her children. Pt. Says "that's what my mom says." Staff will monitor q28min for safety. Pt. Is safe on the unit.

## 2011-11-27 NOTE — Tx Team (Signed)
Interdisciplinary Treatment Plan Update (Adult)  Date:  11/27/2011  Time Reviewed:  11:06 AM   Progress in Treatment: Attending groups: Yes Participating in groups:  Yes Taking medication as prescribed: Yes Tolerating medication:  Yes Family/Significant other contact made:  Counselor assessing for appropriate contact  Patient understands diagnosis:  Yes Discussing patient identified problems/goals with staff:  Yes Medical problems stabilized or resolved:  Yes Denies suicidal/homicidal ideation: Yes Issues/concerns per patient self-inventory:  None identified Other: N/A  New problem(s) identified: None Identified  Reason for Continuation of Hospitalization: Anxiety Depression Medication stabilization Suicidal ideation  Interventions implemented related to continuation of hospitalization: mood stabilization, medication monitoring and adjustment, group therapy and psycho education, safety checks q 15 mins  Additional comments: N/A  Estimated length of stay: 3-5 days  Discharge Plan: SW will assess for appropriate referrals.  New goal(s): N/A  Review of initial/current patient goals per problem list:    1.  Goal(s): Reduce depressive symptoms  Met:  No  Target date: by discharge  As evidenced by: Reducing depression from a 10 to a 3 as reported by pt.  Pt ranks at a 7 today.  2.  Goal (s): Reduce/Eliminate suicidal ideation  Met:  No  Target date: by discharge  As evidenced by: pt reporting no SI.    3.  Goal(s): Reduce anxiety symptoms  Met:  No  Target date: by discharge  As evidenced by: Reduce anxiety from a 10 to a 3 as reported by pt.    Attendees: Patient:  Jeanette Wilson 11/27/2011 11:09 AM   Family:     Physician:  Orson Aloe, MD  11/27/2011  11:06 AM   Nursing:   Manuela Schwartz, RN 11/27/2011 11:09 AM   Case Manager:  Reyes Ivan, LCSWA 11/27/2011  11:06 AM   Counselor:  Angus Palms, LCSW 11/27/2011  11:06 AM   Other:  Juline Patch,  LCSW 11/27/2011  11:06 AM   Other:  Omelia Blackwater, RN 11/27/2011  11:06 AM   Other:     Other:      Scribe for Treatment Team:   Carmina Miller, 11/27/2011 , 11:06 AM

## 2011-11-28 MED ORDER — QUETIAPINE FUMARATE 100 MG PO TABS
100.0000 mg | ORAL_TABLET | Freq: Every evening | ORAL | Status: DC | PRN
  Administered 2011-11-28: 100 mg via ORAL
  Filled 2011-11-28 (×7): qty 1

## 2011-11-28 MED ORDER — QUETIAPINE 12.5 MG HALF TABLET
12.5000 mg | ORAL_TABLET | Freq: Two times a day (BID) | ORAL | Status: DC
  Administered 2011-11-28 – 2011-11-29 (×2): 12.5 mg via ORAL
  Filled 2011-11-28 (×3): qty 1
  Filled 2011-11-28: qty 14
  Filled 2011-11-28: qty 1
  Filled 2011-11-28: qty 14

## 2011-11-28 MED ORDER — QUETIAPINE FUMARATE 25 MG PO TABS
ORAL_TABLET | ORAL | Status: AC
  Filled 2011-11-28: qty 1
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
## 2011-11-28 NOTE — Progress Notes (Signed)
Patient denied SI and HI.   Denied A/V hallucinations.   Denied pain.   Has been cooperative and pleasant this afternoon.

## 2011-11-28 NOTE — Progress Notes (Signed)
[facility] Group Notes:  (Counselor/Nursing/MHT/Case Management/Adjunct) 11/28/2011  11:00am [facility]   Type of Therapy:  Group Therapy  Participation Level:  Did Not Attend      Billie Lade 11/28/2011  2:37 PM

## 2011-11-28 NOTE — Progress Notes (Signed)
Patient ID: Jeanette Wilson, female   DOB: 11-20-1980, 31 y.o.   MRN: [id number]   Patient drowsy on approach this am. States that she is still feeling the effects of the medicine last night. Gives depression "1 on scale and feelings of hopelessness "4". Currently denies any SI today. Happy about sleeping but not liking the continued drowsiness affects. Will notify physician. Staff will monitor and encourage group attendance.

## 2011-11-28 NOTE — Progress Notes (Signed)
Pt attended discharge planning group and minimally participated.  Pt was drowsy and was resting but listening during group.  Pt states that she is very sleepy today and having a hard time staying awake right now.  Pt states that she is sleeping well at night and feels like she will be ready to d/c tomorrow.  Pt ranks depression and anxiety at a 0 today.  Pt denies SI.  Pt will follow up at [facility] for medication management and therapy.  No further needs voiced by pt at this time.  Safety planning and suicide prevention discussed.     Reyes Ivan, LCSWA 11/28/2011  10:54 AM

## 2011-11-28 NOTE — Progress Notes (Signed)
[facility] Group Notes:  (Counselor/Nursing/MHT/Case Management/Adjunct) 11/28/2011 11:00am Finding Balance in Life   Type of Therapy:  Group Therapy  Participation Level:  Did Not Attend     Jeanette Wilson 11/28/2011  2:36 PM

## 2011-11-29 DIAGNOSIS — F411 Generalized anxiety disorder: Secondary | ICD-10-CM

## 2011-11-29 DIAGNOSIS — F333 Major depressive disorder, recurrent, severe with psychotic symptoms: Principal | ICD-10-CM

## 2011-11-29 MED ORDER — CITALOPRAM HYDROBROMIDE 20 MG PO TABS: 20.0000 mg | ORAL_TABLET | Freq: Every day | ORAL | Status: DC

## 2011-11-29 MED ORDER — CIPROFLOXACIN HCL 250 MG PO TABS: 250.0000 mg | ORAL_TABLET | Freq: Two times a day (BID) | ORAL | Status: AC

## 2011-11-29 MED ORDER — QUETIAPINE FUMARATE 25 MG PO TABS: 12.5000 mg | ORAL_TABLET | Freq: Two times a day (BID) | ORAL | Status: DC

## 2011-11-29 NOTE — Tx Team (Signed)
Interdisciplinary Treatment Plan Update (Adult)  Date:  11/29/2011  Time Reviewed:  11:08 AM   Progress in Treatment: Attending groups: Yes Participating in groups:  Yes Taking medication as prescribed: Yes Tolerating medication:  Yes Family/Significant other contact made:  Yes Patient understands diagnosis:  Yes Discussing patient identified problems/goals with staff:  Yes Medical problems stabilized or resolved:  Yes Denies suicidal/homicidal ideation: Yes Issues/concerns per patient self-inventory:  None identified Other: N/A  New problem(s) identified: None Identified  Reason for Continuation of Hospitalization: Stable to d/c  Interventions implemented related to continuation of hospitalization: Stable to d/c  Additional comments: N/A  Estimated length of stay: D/C today  Discharge Plan: Pt will follow up at [facility] for medication management and therapy.   New goal(s): N/A  Review of initial/current patient goals per problem list:    1.  Goal(s): Reduce depressive symptoms  Met:  Yes  Target date: by discharge  As evidenced by: Reducing depression from a 10 to a 3 as reported by pt. Pt ranks at a 2 today.   2.  Goal (s): Reduce/Eliminate suicidal ideation  Met:  Yes  Target date: by discharge  As evidenced by: pt denies SI.    3.  Goal(s): Reduce anxiety symptoms  Met:  Yes  Target date: by discharge  As evidenced by: Reduce anxiety from a 10 to a 3 as reported by pt. Pt ranks at a 2 today.    Attendees: Patient:  Jeanette Wilson 11/29/2011 11:10 AM   Family:     Physician:  Orson Aloe, MD  11/29/2011  11:08 AM   Nursing:   Manuela Schwartz, RN 11/29/2011 11:11 AM   Case Manager:  Reyes Ivan, LCSWA 11/29/2011  11:08 AM   Counselor:  Angus Palms, LCSW 11/29/2011  11:08 AM   Other:  Juline Patch, LCSW 11/29/2011  11:08 AM   Other:    Other:     Other:      Scribe for Treatment Team:   Carmina Miller, 11/29/2011 , 11:08 AM

## 2011-11-29 NOTE — Discharge Summary (Signed)
Physician Discharge Summary Note  Patient:  Jeanette Wilson is an 30 y.o., female MRN:  [id number] DOB:  [birth year] Patient phone:  [phone] (home)  Patient address:   [address] [place] [address],   Date of Admission:  11/27/2011  Date of Discharge: 11/29/11  Reason for Admission: Anxiety attacks related increased depression.  Discharge Diagnoses: Principal Problem:  *Generalized anxiety disorder Active Problems:  Major depressive disorder, recurrent, severe with psychotic features   Axis Diagnosis:   AXIS I:  Major depressive disorder, severe, with psychotic features., Generalized anxiety disorder AXIS II:  Deferred AXIS III:   Past Medical History  Diagnosis Date  . UTI (urinary tract infection) tubiligation  . Depression    AXIS IV:  economic problems, occupational problems and other psychosocial or environmental problems AXIS V:  70  Level of Care:  OP  Hospital Course: This is a 31 year old African-American female, admitted to [facility] from the [facility] ED with complaints of suicidal thoughts. Patient reports, "I had an anxiety attack while at work yesterday. When it started, I felt my heart racing, my hands getting numb and I got very dizzy then. I believe it is due to stress and depression. I am a single mother of 2 girls, trying to financially care for them is tough. I have been depressed x 6 years. My depression started when I learnt that I will be raising and caring for my daughters alone. Then around that time, my 70 year old brother dying at the age of 70 due to brain tumor and heart problems did not help my depression either. I miss him a lot and I still have not dealt with his death. My mother and my siblings are all depressed. We are not actually helping each other cope because every body is at different stages of the grieving process.  While a patient in this hospital, patient received medication management as well group counseling. She took  and tolerated her treatment regimen without any significant adverse effects and or reactions noted and or reported. Patient participated minimally in group counseling, most of the time keeping to herself. She also received antibiotic therapy Cipro 250 mg bid x 3 days for urinary tract infections. She will complete this course of antibiotic therapy at home.  Patient attended treatment team this am and agreed with the team that she is stable for discharge. She will continue psychiatric care on an outpatient basis at [facility]. The address, date and time for this appointment provided for patient. Patient also was provided with 2 weeks worth samples of her discharge medications. She adamantly denies any suicidal, homicidal ideations, auditory and or visual hallucinations.  She left [facility] facility with all personal belongings via personal arranged transport in no apparent distress. Ad to what she learned from being in this hospital, patient answered, "keep up with my medicine, be patient and pace myself, things do change".    Consults:  None  Significant Diagnostic Studies:  None  Discharge Vitals:   Blood pressure 111/75, pulse 92, temperature 97.6 F (36.4 C), temperature source Oral, resp. rate 16, last menstrual period 10/25/2011.  Mental Status Exam: See Mental Status Examination and Suicide Risk Assessment completed by Attending Physician prior to discharge.  Discharge destination:  Home  Is patient on multiple antipsychotic therapies at discharge:  No   Has Patient had three or more failed trials of antipsychotic monotherapy by history:  No  Recommended Plan for Multiple Antipsychotic Therapies: NA  Medication List  As of 11/29/2011 12:51 PM   TAKE these medications      Indication    ciprofloxacin 250 MG tablet   Commonly known as: CIPRO   Take 1 tablet (250 mg total) by mouth 2 (two) times daily. For infection       citalopram 20 MG tablet   Commonly known as: CELEXA   Take 1  tablet (20 mg total) by mouth daily. For depression.       ibuprofen 200 MG tablet   Commonly known as: ADVIL,MOTRIN   Take 1,200 mg by mouth every 8 (eight) hours as needed. For pain       OVER THE COUNTER MEDICATION   Take 1 tablet by mouth every 30 (thirty) days. Iron Slow Release 45mg .       QUEtiapine 25 MG tablet   Commonly known as: SEROQUEL   Take 0.5 tablets (12.5 mg total) by mouth 2 (two) times daily. And 4 at bed time for anxiety, mood disorder, and insomnia            Follow-up Information    Follow up with [facility] on 12/02/2011. (Walk in clinic Monday - Friday, 8 am - 3 pm)    Contact information:   [address], [place] [address] [phone]         Follow-up recommendations:  Other:  Keep all scheduled follow-up appointments as recommended.  Comments:  Take all your medications as prescribed.                       Report adverse effects from medications to your outpatient provider promptly.  SignedArmandina Stammer I 11/29/2011, 12:51 PM

## 2011-11-29 NOTE — Progress Notes (Signed)
[facility] Group Notes:  (Counselor/Nursing/MHT/Case Management/Adjunct)  11/29/2011 11:53 AM  Type of Therapy:  Group Therapy  Participation Level:  Did not attend  Luanna Cole 11/29/2011, 11:53 AM

## 2011-11-29 NOTE — Progress Notes (Signed)
[facility] Case Management Discharge Plan:  Will you be returning to the same living situation after discharge: Yes,  return home At discharge, do you have transportation home?:Yes,  access to transportation Do you have the ability to pay for your medications:Yes,  supply of meds provided  Release of information consent forms completed and in the chart;  Patient's signature needed at discharge.  Patient to Follow up at:  Follow-up Information    Follow up with [facility] on 12/02/2011. (Walk in clinic Monday - Friday, 8 am - 3 pm)    [address]:   [address], [place] [address] [phone]         Patient denies SI/HI:   Yes,  denies SI/HI today    Safety Planning and Suicide Prevention discussed:  Yes,  discussed with pt  Barrier to discharge identified:No.  Summary and Recommendations: Pt attended discharge planning group and actively participated.  Pt presents with bright mood and affect.  Pt ranks depression and anxiety at a 2 today.  Pt denies SI.  Pt reports feeling alert today.  No recommendations from SW.  No further needs voiced by pt.  Pt stable to discharge.     Carmina Miller 11/29/2011, 10:48 AM

## 2011-11-29 NOTE — Progress Notes (Signed)
Patient ID: Jeanette Wilson, female   DOB: 12/04/1980, 31 y.o.   MRN: [id number] Has been rather isolative this evening, was in bed, lights out, talking to roommate at the beginning of the shift, wasn't interested in going to [place], and stayed in bed while others were gone.  Did come out to dayroom for snack and hs meds at the med window, but conversation was brief and to the point. Denied sx of UTI and hadn't realized she had an infection.  Remains quiet and rather withdrawn tonight. Will continue to try to establish rapport. Will continue to monitor.

## 2011-11-29 NOTE — Discharge Summary (Signed)
I agree with this D/C Summary.  

## 2011-11-29 NOTE — Progress Notes (Signed)
Patient ID: Jeanette Wilson, female   DOB: March 21, 1981, 31 y.o.   MRN: [id number] Appropriate during d/c process. Denies SI/HI and states all treatment goals were met.  Reviewed all medication and samples given.  Reviewed f/u and consents signed.  All belongings returned and escorted to lobby to care of BF.

## 2011-12-02 NOTE — Progress Notes (Signed)
Patient Discharge Instructions:  Psychiatric Admission Assessment Note Provided,  12/02/2011 After Visit Summary (AVS) Provided,  12/02/2011 Face Sheet Provided, 12/02/2011 Faxed/Sent to the Next Level Care provider:  12/02/2011 Sent Suicide Risk Assessment - Discharge Assessment 12/02/2011  Faxed to [facility] @ [phone]  Wandra Scot, 12/02/2011, 3:26 PM

## 2012-02-04 ENCOUNTER — Encounter (HOSPITAL_COMMUNITY): Payer: Self-pay

## 2012-02-04 ENCOUNTER — Inpatient Hospital Stay (HOSPITAL_COMMUNITY)
Admission: AD | Admit: 2012-02-04 | Discharge: 2012-02-04 | Disposition: A | Discharge status: Home / Self Care | Payer: Medicaid Other | Source: Intra-hospital | Attending: Obstetrics & Gynecology | Admitting: Obstetrics & Gynecology

## 2012-02-04 DIAGNOSIS — R109 Unspecified abdominal pain: Secondary | ICD-10-CM | POA: Insufficient documentation

## 2012-02-04 DIAGNOSIS — N3090 Cystitis, unspecified without hematuria: Secondary | ICD-10-CM | POA: Insufficient documentation

## 2012-02-04 DIAGNOSIS — N760 Acute vaginitis: Secondary | ICD-10-CM

## 2012-02-04 DIAGNOSIS — B9689 Other specified bacterial agents as the cause of diseases classified elsewhere: Secondary | ICD-10-CM | POA: Insufficient documentation

## 2012-02-04 DIAGNOSIS — A499 Bacterial infection, unspecified: Secondary | ICD-10-CM | POA: Insufficient documentation

## 2012-02-04 LAB — GC/CHLAMYDIA PROBE AMP, GENITAL
Chlamydia, DNA Probe: NEGATIVE
GC Probe Amp, Genital: NEGATIVE

## 2012-02-04 LAB — URINALYSIS, ROUTINE W REFLEX MICROSCOPIC
Glucose, UA: NEGATIVE mg/dL
Protein, ur: NEGATIVE mg/dL
Specific Gravity, Urine: 1.02 (ref 1.005–1.030)
pH: 6.5 (ref 5.0–8.0)

## 2012-02-04 LAB — POCT PREGNANCY, URINE: Preg Test, Ur: NEGATIVE

## 2012-02-04 LAB — WET PREP, GENITAL

## 2012-02-04 LAB — URINE MICROSCOPIC-ADD ON

## 2012-02-04 MED ORDER — SULFAMETHOXAZOLE-TMP DS 800-160 MG PO TABS
1.0000 | ORAL_TABLET | Freq: Once | ORAL | Status: AC
  Administered 2012-02-04: 1 via ORAL
  Filled 2012-02-04: qty 1

## 2012-02-04 MED ORDER — PHENAZOPYRIDINE HCL 100 MG PO TABS: 100.0000 mg | ORAL_TABLET | Freq: Three times a day (TID) | ORAL | Status: AC | PRN

## 2012-02-04 MED ORDER — PHENAZOPYRIDINE HCL 100 MG PO TABS
200.0000 mg | ORAL_TABLET | Freq: Three times a day (TID) | ORAL | Status: DC
  Administered 2012-02-04: 200 mg via ORAL
  Filled 2012-02-04: qty 2

## 2012-02-04 MED ORDER — METRONIDAZOLE 500 MG PO TABS: 500.0000 mg | ORAL_TABLET | Freq: Two times a day (BID) | ORAL | Status: AC

## 2012-02-04 MED ORDER — FLUCONAZOLE 150 MG PO TABS
150.0000 mg | ORAL_TABLET | Freq: Once | ORAL | Status: AC
  Administered 2012-02-04: 150 mg via ORAL
  Filled 2012-02-04: qty 1

## 2012-02-04 MED ORDER — SULFAMETHOXAZOLE-TRIMETHOPRIM 800-160 MG PO TABS: 1.0000 | ORAL_TABLET | Freq: Two times a day (BID) | ORAL | Status: AC

## 2012-02-04 NOTE — MAU Note (Signed)
Pt states lower abd pain, noted x3 days, Vaginal d/c noted, odorous, has been white/yellow/clear/cloudy. Denies abnormal vaginal bleeding. Used a douche after menses, then noted d/c changes.

## 2012-02-04 NOTE — MAU Note (Signed)
No adverse effect from pyridium, septra, or diflucan.

## 2012-02-04 NOTE — Discharge Instructions (Signed)
Bacterial Vaginosis Bacterial vaginosis (BV) is a vaginal infection where the normal balance of bacteria in the vagina is disrupted. The normal balance is then replaced by an overgrowth of certain bacteria. There are several different kinds of bacteria that can cause BV. BV is the most common vaginal infection in women of childbearing age. CAUSES   The cause of BV is not fully understood. BV develops when there is an increase or imbalance of harmful bacteria.   Some activities or behaviors can upset the normal balance of bacteria in the vagina and put women at increased risk including:   Having a new sex partner or multiple sex partners.   Douching.   Using an intrauterine device (IUD) for contraception.   It is not clear what role sexual activity plays in the development of BV. However, women that have never had sexual intercourse are rarely infected with BV.  Women do not get BV from toilet seats, bedding, swimming pools or from touching objects around them.  SYMPTOMS   Grey vaginal discharge.   A fish-like odor with discharge, especially after sexual intercourse.   Itching or burning of the vagina and vulva.   Burning or pain with urination.   Some women have no signs or symptoms at all.  DIAGNOSIS  Your caregiver must examine the vagina for signs of BV. Your caregiver will perform lab tests and look at the sample of vaginal fluid through a microscope. They will look for bacteria and abnormal cells (clue cells), a pH test higher than 4.5, and a positive amine test all associated with BV.  RISKS AND COMPLICATIONS   Pelvic inflammatory disease (PID).   Infections following gynecology surgery.   Developing HIV.   Developing herpes virus.  TREATMENT  Sometimes BV will clear up without treatment. However, all women with symptoms of BV should be treated to avoid complications, especially if gynecology surgery is planned. Female partners generally do not need to be treated. However,  BV may spread between female sex partners so treatment is helpful in preventing a recurrence of BV.   BV may be treated with antibiotics. The antibiotics come in either pill or vaginal cream forms. Either can be used with nonpregnant or pregnant women, but the recommended dosages differ. These antibiotics are not harmful to the baby.   BV can recur after treatment. If this happens, a second round of antibiotics will often be prescribed.   Treatment is important for pregnant women. If not treated, BV can cause a premature delivery, especially for a pregnant woman who had a premature birth in the past. All pregnant women who have symptoms of BV should be checked and treated.   For chronic reoccurrence of BV, treatment with a type of prescribed gel vaginally twice a week is helpful.  HOME CARE INSTRUCTIONS   Finish all medication as directed by your caregiver.   Do not have sex until treatment is completed.   Tell your sexual partner that you have a vaginal infection. They should see their caregiver and be treated if they have problems, such as a mild rash or itching.   Practice safe sex. Use condoms. Only have 1 sex partner.  PREVENTION  Basic prevention steps can help reduce the risk of upsetting the natural balance of bacteria in the vagina and developing BV:  Do not have sexual intercourse (be abstinent).   Do not douche.   Use all of the medicine prescribed for treatment of BV, even if the signs and symptoms go away.     Tell your sex partner if you have BV. That way, they can be treated, if needed, to prevent reoccurrence.  SEEK MEDICAL CARE IF:   Your symptoms are not improving after 3 days of treatment.   You have increased discharge, pain, or fever.  MAKE SURE YOU:   Understand these instructions.   Will watch your condition.   Will get help right away if you are not doing well or get worse.  FOR MORE INFORMATION  Division of STD Prevention (DSTDP), Centers for Disease  Control and Prevention: www.cdc.gov/std American Social Health Association (ASHA): www.ashastd.org  Document Released: 08/19/2005 Document Revised: 08/08/2011 Document Reviewed: 02/09/2009 ExitCare Patient Information 2012 [employer].Bacterial Vaginosis Bacterial vaginosis (BV) is a vaginal infection where the normal balance of bacteria in the vagina is disrupted. The normal balance is then replaced by an overgrowth of certain bacteria. There are several different kinds of bacteria that can cause BV. BV is the most common vaginal infection in women of childbearing age. CAUSES   The cause of BV is not fully understood. BV develops when there is an increase or imbalance of harmful bacteria.   Some activities or behaviors can upset the normal balance of bacteria in the vagina and put women at increased risk including:   Having a new sex partner or multiple sex partners.   Douching.   Using an intrauterine device (IUD) for contraception.   It is not clear what role sexual activity plays in the development of BV. However, women that have never had sexual intercourse are rarely infected with BV.  Women do not get BV from toilet seats, bedding, swimming pools or from touching objects around them.  SYMPTOMS   Grey vaginal discharge.   A fish-like odor with discharge, especially after sexual intercourse.   Itching or burning of the vagina and vulva.   Burning or pain with urination.   Some women have no signs or symptoms at all.  DIAGNOSIS  Your caregiver must examine the vagina for signs of BV. Your caregiver will perform lab tests and look at the sample of vaginal fluid through a microscope. They will look for bacteria and abnormal cells (clue cells), a pH test higher than 4.5, and a positive amine test all associated with BV.  RISKS AND COMPLICATIONS   Pelvic inflammatory disease (PID).   Infections following gynecology surgery.   Developing HIV.   Developing herpes virus.    TREATMENT  Sometimes BV will clear up without treatment. However, all women with symptoms of BV should be treated to avoid complications, especially if gynecology surgery is planned. Female partners generally do not need to be treated. However, BV may spread between female sex partners so treatment is helpful in preventing a recurrence of BV.   BV may be treated with antibiotics. The antibiotics come in either pill or vaginal cream forms. Either can be used with nonpregnant or pregnant women, but the recommended dosages differ. These antibiotics are not harmful to the baby.   BV can recur after treatment. If this happens, a second round of antibiotics will often be prescribed.   Treatment is important for pregnant women. If not treated, BV can cause a premature delivery, especially for a pregnant woman who had a premature birth in the past. All pregnant women who have symptoms of BV should be checked and treated.   For chronic reoccurrence of BV, treatment with a type of prescribed gel vaginally twice a week is helpful.  HOME CARE INSTRUCTIONS   Finish all   medication as directed by your caregiver.   Do not have sex until treatment is completed.   Tell your sexual partner that you have a vaginal infection. They should see their caregiver and be treated if they have problems, such as a mild rash or itching.   Practice safe sex. Use condoms. Only have 1 sex partner.  PREVENTION  Basic prevention steps can help reduce the risk of upsetting the natural balance of bacteria in the vagina and developing BV:  Do not have sexual intercourse (be abstinent).   Do not douche.   Use all of the medicine prescribed for treatment of BV, even if the signs and symptoms go away.   Tell your sex partner if you have BV. That way, they can be treated, if needed, to prevent reoccurrence.  SEEK MEDICAL CARE IF:   Your symptoms are not improving after 3 days of treatment.   You have increased discharge, pain,  or fever.  MAKE SURE YOU:   Understand these instructions.   Will watch your condition.   Will get help right away if you are not doing well or get worse.  FOR MORE INFORMATION  Division of STD Prevention (DSTDP), Centers for Disease Control and Prevention: www.cdc.gov/std American Social Health Association (ASHA): www.ashastd.org  Document Released: 08/19/2005 Document Revised: 08/08/2011 Document Reviewed: 02/09/2009 ExitCare Patient Information 2012 [employer]. 

## 2012-02-04 NOTE — MAU Note (Signed)
Pain started a couple days ago, not really burning - more of a warmth.  Denies pain with urination

## 2012-02-04 NOTE — MAU Provider Note (Signed)
History     CSN: [id number]  Arrival date & time 02/04/12  0818   None     Chief Complaint  Patient presents with  . Abdominal Pain    HPI Jeanette Wilson is a 31 y.o. female who presents to MAU for UTI symptoms. She is not pregnant. Symptoms began 3 days ago with frequency, urgency and burning. Feels like bladder is full all the time but only goes a little bit. Condoms for birth control. Current sex partner > 2 years. Hx of HSV. Has had increased vaginal discharge. The history was provided by the patient.  Past Medical History  Diagnosis Date  . UTI (urinary tract infection) tubiligation  . Depression     Past Surgical History  Procedure Date  . Tubal ligation   . Wisdom tooth extraction   . Tonsillectomy     Family History  Problem Relation Age of Onset  . Anesthesia problems Neg Hx   . Hypotension Neg Hx   . Malignant hyperthermia Neg Hx   . Pseudochol deficiency Neg Hx     History  Substance Use Topics  . Smoking status: Passive Smoker -- 0.0 packs/day    Types: Cigarettes  . Smokeless tobacco: Current User  . Alcohol Use: Yes     social drinking  5th of liquor weekly    OB History    Grav Para Term Preterm Abortions TAB SAB Ect Mult Living   2 2 2  0 0 0 0 0 0 2      Review of Systems  Constitutional: Positive for chills and appetite change. Negative for fever, diaphoresis and fatigue.  HENT: Negative for ear pain, congestion, sore throat, facial swelling, neck pain, neck stiffness, dental problem and sinus pressure.   Eyes: Negative for photophobia, pain and discharge.  Respiratory: Negative for cough, chest tightness and wheezing.   Cardiovascular: Negative for chest pain and palpitations.  Gastrointestinal: Positive for abdominal pain. Negative for nausea, vomiting, diarrhea, constipation and abdominal distention.  Genitourinary: Positive for dysuria, urgency, frequency, vaginal discharge and pelvic pain. Negative for hematuria, flank pain, vaginal  bleeding and difficulty urinating.  Musculoskeletal: Negative for myalgias, back pain and gait problem.  Skin: Negative for color change and rash.  Neurological: Negative for dizziness, speech difficulty, weakness, light-headedness, numbness and headaches.  Psychiatric/Behavioral: Negative for confusion and agitation. The patient is not nervous/anxious.        History of depression    Allergies  Review of patient's allergies indicates no known allergies.  Home Medications  No current outpatient prescriptions on file.  BP 115/86  Pulse 69  Temp(Src) 98.2 F (36.8 C) (Oral)  Resp 16  LMP 01/26/2012  Physical Exam  Nursing note and vitals reviewed. Constitutional: She is oriented to person, place, and time. She appears well-developed and well-nourished. No distress.  HENT:  Head: Normocephalic.  Eyes: EOM are normal.  Neck: Neck supple.  Cardiovascular: Normal rate.   Pulmonary/Chest: Effort normal.  Abdominal: Soft. There is tenderness in the suprapubic area. There is no CVA tenderness.  Genitourinary:       External genitalia without lesions. Thick white malodorous discharge vaginal vault. No CMT, no adnexal mass palpated. Uterus without palpable enlargement.  Musculoskeletal: Normal range of motion.  Neurological: She is alert and oriented to person, place, and time. No cranial nerve deficit.  Skin: Skin is warm and dry.  Psychiatric: She has a normal mood and affect. Her behavior is normal. Judgment and thought content normal.   Results  for orders placed during the hospital encounter of 02/04/12 (from the past 24 hour(s))  URINALYSIS, ROUTINE W REFLEX MICROSCOPIC     Status: Abnormal   Collection Time   02/04/12  8:20 AM      Component Value Range   Color, Urine YELLOW  YELLOW    APPearance CLOUDY (*) CLEAR    Specific Gravity, Urine 1.020  1.005 - 1.030    pH 6.5  5.0 - 8.0    Glucose, UA NEGATIVE  NEGATIVE (mg/dL)   Hgb urine dipstick NEGATIVE  NEGATIVE    Bilirubin  Urine NEGATIVE  NEGATIVE    Ketones, ur NEGATIVE  NEGATIVE (mg/dL)   Protein, ur NEGATIVE  NEGATIVE (mg/dL)   Urobilinogen, UA 0.2  0.0 - 1.0 (mg/dL)   Nitrite NEGATIVE  NEGATIVE    Leukocytes, UA SMALL (*) NEGATIVE   URINE MICROSCOPIC-ADD ON     Status: Abnormal   Collection Time   02/04/12  8:20 AM      Component Value Range   Squamous Epithelial / LPF MANY (*) RARE    WBC, UA 7-10  <3 (WBC/hpf)   Bacteria, UA FEW (*) RARE   POCT PREGNANCY, URINE     Status: Normal   Collection Time   02/04/12  8:53 AM      Component Value Range   Preg Test, Ur NEGATIVE  NEGATIVE   WET PREP, GENITAL     Status: Abnormal   Collection Time   02/04/12  8:59 AM      Component Value Range   Yeast Wet Prep HPF POC FEW (*) NONE SEEN    Trich, Wet Prep NONE SEEN  NONE SEEN    Clue Cells Wet Prep HPF POC MANY (*) NONE SEEN    WBC, Wet Prep HPF POC MANY (*) NONE SEEN    Assessment: Cystitis   Bacterial vaginosis  Plan:  Pyridium Rx   Septra DS Rx   Flagyl Rx   Follow up with [facility]   Return here as needed. ED Course  Procedures    MDM

## 2012-05-28 ENCOUNTER — Encounter (HOSPITAL_COMMUNITY): Payer: Self-pay | Admitting: *Deleted

## 2012-05-28 ENCOUNTER — Emergency Department (HOSPITAL_COMMUNITY)
Admission: EM | Admit: 2012-05-28 | Discharge: 2012-05-28 | Disposition: A | Discharge status: Home / Self Care | Payer: Medicaid Other | Attending: Emergency Medicine | Admitting: Emergency Medicine

## 2012-05-28 DIAGNOSIS — F410 Panic disorder [episodic paroxysmal anxiety] without agoraphobia: Secondary | ICD-10-CM

## 2012-05-28 DIAGNOSIS — F411 Generalized anxiety disorder: Secondary | ICD-10-CM | POA: Insufficient documentation

## 2012-05-28 DIAGNOSIS — F419 Anxiety disorder, unspecified: Secondary | ICD-10-CM

## 2012-05-28 DIAGNOSIS — R0789 Other chest pain: Secondary | ICD-10-CM | POA: Insufficient documentation

## 2012-05-28 DIAGNOSIS — F209 Schizophrenia, unspecified: Secondary | ICD-10-CM

## 2012-05-28 DIAGNOSIS — F333 Major depressive disorder, recurrent, severe with psychotic symptoms: Secondary | ICD-10-CM

## 2012-05-28 HISTORY — DX: Anxiety disorder, unspecified: F41.9

## 2012-05-28 HISTORY — DX: Schizophrenia, unspecified: F20.9

## 2012-05-28 MED ORDER — LORAZEPAM 1 MG PO TABS
1.0000 mg | ORAL_TABLET | Freq: Once | ORAL | Status: AC
  Administered 2012-05-28: 1 mg via ORAL
  Filled 2012-05-28: qty 1

## 2012-05-28 MED ORDER — IBUPROFEN 800 MG PO TABS
800.0000 mg | ORAL_TABLET | Freq: Once | ORAL | Status: AC
Start: 2012-05-28 — End: 2012-05-28
  Administered 2012-05-28: 800 mg via ORAL
  Filled 2012-05-28: qty 1

## 2012-05-28 MED ORDER — CITALOPRAM HYDROBROMIDE 20 MG PO TABS: 20.0000 mg | ORAL_TABLET | Freq: Every day | ORAL | Status: DC

## 2012-05-28 MED ORDER — QUETIAPINE FUMARATE 50 MG PO TABS: 25.0000 mg | ORAL_TABLET | Freq: Two times a day (BID) | ORAL | Status: DC

## 2012-05-28 NOTE — ED Provider Notes (Signed)
History     CSN: [id number]  Arrival date & time 05/28/12  1351   First MD Initiated Contact with Patient 05/28/12 1410      Chief Complaint  Patient presents with  . Panic Attack    (Consider location/radiation/quality/duration/timing/severity/associated sxs/prior treatment) HPI Comments: Jeanette Wilson 31 y.o. female   The chief complaint is: Patient presents with:   Panic Attack   The patient has medical history significant for:   Past Medical History:   UTI (urinary tract infection)                   tubiligat*   Depression                                                   Anxiety                                                      Schizophrenia                                               Patient presents with a complaint of chest heaviness and anxiety. She states that feeling in her chest is similar to her other panic attacks. She states that she is stressed out about her children, as she no longer has custody and they have been given to her parents. She did not go into much detail after that. Denies fever or chills. Denies NVD or abdominal pain. Denies SOB. Denies suicidal or homicidal ideations.      The history is provided by the patient. No language interpreter was used.    Past Medical History  Diagnosis Date  . UTI (urinary tract infection) tubiligation  . Depression   . Anxiety   . Schizophrenia     Past Surgical History  Procedure Date  . Tubal ligation   . Wisdom tooth extraction   . Tonsillectomy     Family History  Problem Relation Age of Onset  . Anesthesia problems Neg Hx   . Hypotension Neg Hx   . Malignant hyperthermia Neg Hx   . Pseudochol deficiency Neg Hx     History  Substance Use Topics  . Smoking status: Passive Smoke Exposure - Never Smoker -- 0.0 packs/day    Types: Cigarettes  . Smokeless tobacco: Current User  . Alcohol Use: Yes     social drinking  5th of liquor weekly    OB History    Grav Para Term Preterm  Abortions TAB SAB Ect Mult Living   2 2 2  0 0 0 0 0 0 2      Review of Systems  Constitutional: Negative for fever and chills.  Respiratory: Negative for shortness of breath.   Cardiovascular: Positive for chest pain.  Gastrointestinal: Negative for nausea, vomiting, abdominal pain and diarrhea.  Psychiatric/Behavioral: Positive for suicidal ideas. The patient is nervous/anxious.     Allergies  Review of patient's allergies indicates no known allergies.  Home Medications   Current Outpatient Rx  Name Route Sig Dispense Refill  . NAPROXEN  SODIUM 220 MG PO TABS Oral Take 440 mg by mouth 2 (two) times daily as needed. For pain.    Marland Kitchen CITALOPRAM HYDROBROMIDE 20 MG PO TABS Oral Take 1 tablet (20 mg total) by mouth daily. For depression. 30 tablet 0  . OVER THE COUNTER MEDICATION Oral Take 1 tablet by mouth every 30 (thirty) days. Iron Slow Release 45mg .    . QUETIAPINE FUMARATE 25 MG PO TABS Oral Take 0.5 tablets (12.5 mg total) by mouth 2 (two) times daily. And 4 at bed time for anxiety, mood disorder, and insomnia 150 tablet 0    BP 123/83  Pulse 81  Temp 98.4 F (36.9 C) (Oral)  Resp 20  SpO2 100%  LMP 05/21/2012  Physical Exam  Nursing note and vitals reviewed. Constitutional: She appears well-developed and well-nourished. She appears distressed.  HENT:  Head: Normocephalic and atraumatic.  Mouth/Throat: No oropharyngeal exudate.  Eyes: Conjunctivae normal and EOM are normal. No scleral icterus.  Neck: Normal range of motion. Neck supple.  Cardiovascular: Normal rate, regular rhythm and normal heart sounds.   Pulmonary/Chest: Effort normal and breath sounds normal.  Abdominal: Soft. Bowel sounds are normal. There is no tenderness.  Neurological: She is alert.  Skin: Skin is warm and dry.  Psychiatric:       Patient seems depressed although she reports anxiety. She frequently does not make eye contact during encounter.    ED Course  Procedures (including critical care  time)  Labs Reviewed - No data to display No results found.  Date: 05/28/2012  Rate: 75  Rhythm: normal sinus rhythm  QRS Axis: normal  Intervals: normal  ST/T Wave abnormalities: normal  Conduction Disutrbances:none  Narrative Interpretation: No STEMI  Old EKG Reviewed: unchanged    1. Anxiety   2. Major depressive disorder, recurrent, severe with psychotic features   3. Schizophrenia   4. Panic attack       MDM  Patient presented with complaint of panic attack and chest heaviness. EKG: unremarkable. Patient give ativan with some improvement. Patient discharged with instructions to follow up with her psychiatrist. No red flags for suicidal or homicidal ideations. Return precautions given verbally and in discharge summary.        Pixie Casino, PA-C 05/28/12 [phone]

## 2012-05-28 NOTE — Progress Notes (Signed)
Received rx fro seroquel and celexa from PA-C [facility]ed [facility] tub[facility] with instructions to call triage RN a [phone] when ready CM spoke with ED RN and pt The pt confirms no further medicaid nor pcp Reports she had been to [facility] previously and had seen [facility] provider. Reviewed list of self pay providers and [facility] services to follow up for further medication Rxs and care.  Reviewed discount pharmacies and needymeds.org to assist with getting her medications Pt given written information with these resources Pt voiced understanding and appreciation of services and resources offered Assisted pt to find ed lobby telephone to assist her in making a telephone call

## 2012-05-28 NOTE — ED Notes (Signed)
Pt states "I had pain in my chest, a panic attack while I was at work." Pt reports heaviness at present and HA.

## 2012-05-28 NOTE — Progress Notes (Signed)
[facility] ED CM contacted by, Tia, PA-C, to assist with indigent medications CM verified with Jillyn Hidden in ED registration that pt medicaid is not active Cm verified with andrea in [facility] that pt is eligible for [facility] medication assistance Updated PA-C that pt is eligible Pending Rx to process in [facility]

## 2012-05-28 NOTE — ED Provider Notes (Signed)
Medical screening examination/treatment/procedure(s) were performed by non-physician practitioner and as supervising physician I was immediately available for consultation/collaboration.   Lyanne Co, MD 05/28/12 [phone]

## 2012-08-17 ENCOUNTER — Inpatient Hospital Stay (HOSPITAL_COMMUNITY): Payer: Medicaid Other

## 2012-08-17 ENCOUNTER — Inpatient Hospital Stay (HOSPITAL_COMMUNITY)
Admission: AD | Admit: 2012-08-17 | Discharge: 2012-08-17 | Disposition: A | Discharge status: Home / Self Care | Payer: Medicaid Other | Source: Ambulatory Visit | Attending: Obstetrics & Gynecology | Admitting: Obstetrics & Gynecology

## 2012-08-17 ENCOUNTER — Encounter (HOSPITAL_COMMUNITY): Payer: Self-pay | Admitting: Advanced Practice Midwife

## 2012-08-17 DIAGNOSIS — N390 Urinary tract infection, site not specified: Secondary | ICD-10-CM

## 2012-08-17 DIAGNOSIS — R1032 Left lower quadrant pain: Secondary | ICD-10-CM

## 2012-08-17 DIAGNOSIS — R300 Dysuria: Secondary | ICD-10-CM | POA: Insufficient documentation

## 2012-08-17 DIAGNOSIS — N949 Unspecified condition associated with female genital organs and menstrual cycle: Secondary | ICD-10-CM | POA: Insufficient documentation

## 2012-08-17 LAB — URINALYSIS, ROUTINE W REFLEX MICROSCOPIC
Glucose, UA: NEGATIVE mg/dL
Protein, ur: NEGATIVE mg/dL
Specific Gravity, Urine: 1.015 (ref 1.005–1.030)

## 2012-08-17 LAB — CBC
Hemoglobin: 13.8 g/dL (ref 12.0–15.0)
MCHC: 33.6 g/dL (ref 30.0–36.0)
Platelets: 269 10*3/uL (ref 150–400)
RDW: 12.5 % (ref 11.5–15.5)

## 2012-08-17 LAB — URINE MICROSCOPIC-ADD ON

## 2012-08-17 LAB — WET PREP, GENITAL: Yeast Wet Prep HPF POC: NONE SEEN

## 2012-08-17 MED ORDER — SIMETHICONE 125 MG PO CHEW: 125.0000 mg | CHEWABLE_TABLET | Freq: Four times a day (QID) | ORAL | Status: DC | PRN

## 2012-08-17 MED ORDER — METRONIDAZOLE 500 MG PO TABS: 500.0000 mg | ORAL_TABLET | Freq: Two times a day (BID) | ORAL | Status: DC

## 2012-08-17 MED ORDER — KETOROLAC TROMETHAMINE 60 MG/2ML IM SOLN
60.0000 mg | Freq: Once | INTRAMUSCULAR | Status: AC
  Administered 2012-08-17: 60 mg via INTRAMUSCULAR
  Filled 2012-08-17: qty 2

## 2012-08-17 MED ORDER — PHENAZOPYRIDINE HCL 200 MG PO TABS: 200.0000 mg | ORAL_TABLET | Freq: Three times a day (TID) | ORAL | Status: DC | PRN

## 2012-08-17 MED ORDER — IBUPROFEN 800 MG PO TABS: 800.0000 mg | ORAL_TABLET | Freq: Three times a day (TID) | ORAL | Status: DC | PRN

## 2012-08-17 MED ORDER — SULFAMETHOXAZOLE-TMP DS 800-160 MG PO TABS: 1.0000 | ORAL_TABLET | Freq: Two times a day (BID) | ORAL | Status: DC

## 2012-08-17 MED ORDER — PHENAZOPYRIDINE HCL 100 MG PO TABS
200.0000 mg | ORAL_TABLET | Freq: Once | ORAL | Status: AC
  Administered 2012-08-17: 200 mg via ORAL
  Filled 2012-08-17 (×2): qty 1

## 2012-08-17 NOTE — MAU Note (Signed)
Pt presents for left lower abdominal pain, states it its her "left ovary" that started a week ago.  Currently has a discharge with an odor, but denies any bleeding.

## 2012-08-17 NOTE — MAU Note (Signed)
LLQ pain x 1 week, Pt has hx of ovarian cysts.  Pt denies bleeding.

## 2012-08-17 NOTE — MAU Provider Note (Signed)
History     CSN: [id number]  Arrival date and time: 08/17/12 0846   None     Chief Complaint  Patient presents with  . Abdominal Pain   HPI 31 y.o. [id number] with LLQ pain x 1 week, worsening, + dysuria, frequency, urgency. + vaginal discharge with odor, no bleeding.    Past Medical History  Diagnosis Date  . UTI (urinary tract infection) tubiligation  . Depression   . Anxiety   . Schizophrenia     Past Surgical History  Procedure Date  . Tubal ligation   . Wisdom tooth extraction   . Tonsillectomy     Family History  Problem Relation Age of Onset  . Anesthesia problems Neg Hx   . Hypotension Neg Hx   . Malignant hyperthermia Neg Hx   . Pseudochol deficiency Neg Hx     History  Substance Use Topics  . Smoking status: Passive Smoke Exposure - Never Smoker -- 0.0 packs/day    Types: Cigarettes  . Smokeless tobacco: Current User  . Alcohol Use: Yes     Comment: social drinking  5th of liquor weekly    Allergies: No Known Allergies  No prescriptions prior to admission    Review of Systems  Constitutional: Negative.   Respiratory: Negative.   Cardiovascular: Negative.   Gastrointestinal: Positive for abdominal pain. Negative for nausea, vomiting, diarrhea and constipation.  Genitourinary: Positive for dysuria, urgency and frequency. Negative for hematuria and flank pain.       Positive for vaginal discharge  Musculoskeletal: Negative.   Neurological: Negative.   Psychiatric/Behavioral: Negative.    Physical Exam   Blood pressure 116/80, pulse 82, temperature 98.6 F (37 C), temperature source Oral, resp. rate 16, height 5\' 3"  (1.6 m), weight 120 lb 3.2 oz (54.522 kg), last menstrual period 07/14/2012.  Physical Exam  Nursing note and vitals reviewed. Constitutional: She is oriented to person, place, and time. She appears well-developed and well-nourished. No distress.  HENT:  Head: Normocephalic and atraumatic.  Cardiovascular: Normal rate and  regular rhythm.   Respiratory: Effort normal. No respiratory distress.  GI: Soft. She exhibits no distension and no mass. There is tenderness (suprapubic). There is no rebound and no guarding.  Genitourinary: There is no rash or lesion on the right labia. There is no rash or lesion on the left labia. Uterus is not deviated, not enlarged, not fixed and not tender. Cervix exhibits motion tenderness. Cervix exhibits no discharge and no friability. Right adnexum displays no mass, no tenderness and no fullness. Left adnexum displays tenderness. Left adnexum displays no mass and no fullness. No erythema, tenderness or bleeding around the vagina. Vaginal discharge (thin, white, maldodorous) found.  Neurological: She is alert and oriented to person, place, and time.  Skin: Skin is warm and dry.  Psychiatric: She has a normal mood and affect.    MAU Course  Procedures  Results for orders placed during the hospital encounter of 08/17/12 (from the past 24 hour(s))  URINALYSIS, ROUTINE W REFLEX MICROSCOPIC     Status: Abnormal   Collection Time   08/17/12  9:31 AM      Component Value Range   Color, Urine YELLOW  YELLOW   APPearance CLEAR  CLEAR   Specific Gravity, Urine 1.015  1.005 - 1.030   pH 6.0  5.0 - 8.0   Glucose, UA NEGATIVE  NEGATIVE mg/dL   Hgb urine dipstick NEGATIVE  NEGATIVE   Bilirubin Urine NEGATIVE  NEGATIVE   Ketones, ur  NEGATIVE  NEGATIVE mg/dL   Protein, ur NEGATIVE  NEGATIVE mg/dL   Urobilinogen, UA 0.2  0.0 - 1.0 mg/dL   Nitrite POSITIVE (*) NEGATIVE   Leukocytes, UA TRACE (*) NEGATIVE  URINE MICROSCOPIC-ADD ON     Status: Abnormal   Collection Time   08/17/12  9:31 AM      Component Value Range   Squamous Epithelial / LPF RARE  RARE   WBC, UA 3-6  <3 WBC/hpf   Bacteria, UA MANY (*) RARE   Urine-Other MUCOUS PRESENT    POCT PREGNANCY, URINE     Status: Normal   Collection Time   08/17/12  9:54 AM      Component Value Range   Preg Test, Ur NEGATIVE  NEGATIVE  WET  PREP, GENITAL     Status: Abnormal   Collection Time   08/17/12 12:05 PM      Component Value Range   Yeast Wet Prep HPF POC NONE SEEN  NONE SEEN   Trich, Wet Prep NONE SEEN  NONE SEEN   Clue Cells Wet Prep HPF POC FEW (*) NONE SEEN   WBC, Wet Prep HPF POC TOO NUMEROUS TO COUNT (*) NONE SEEN  CBC     Status: Normal   Collection Time   08/17/12 12:20 PM      Component Value Range   WBC 5.1  4.0 - 10.5 K/uL   RBC 4.38  3.87 - 5.11 MIL/uL   Hemoglobin 13.8  12.0 - 15.0 g/dL   HCT 47.8  29.5 - 62.1 %   MCV 93.8  78.0 - 100.0 fL   MCH 31.5  26.0 - 34.0 pg   MCHC 33.6  30.0 - 36.0 g/dL   RDW 30.8  65.7 - 84.6 %   Platelets 269  150 - 400 K/uL   US Transvaginal Non-ob  08/17/2012  *RADIOLOGY REPORT*  Clinical Data: Left lower quadrant pain for 1 week.  LMP 07/14/2012  TRANSABDOMINAL AND TRANSVAGINAL ULTRASOUND OF PELVIS Technique:  Both transabdominal and transvaginal ultrasound examinations of the pelvis were performed. Transabdominal technique was performed for global imaging of the pelvis including uterus, ovaries, adnexal regions, and pelvic cul-de-sac.  It was necessary to proceed with endovaginal exam following the transabdominal exam to visualize the ovaries.  Comparison:  Pelvic ultrasound 04/14/2011  Findings:  Uterus: Normal in size and appearance.  Measures 7.4 x 3.9 x 4.8 cm and is anteverted.  No focal uterine mass.  Endometrium: Measures 12.3 mm in maximal thickness, within normal limits.  Right ovary:  Normal appearance/no adnexal mass.  Measures 3.2 x 1.7 x 2.4 cm.  Left ovary: Normal appearance/no adnexal mass.  Measures 2.5 x 1.1 x 1.6 cm.  Other findings: No free fluid. Fluid-filled, borderline prominent caliber bowel loops are seen in the pelvis, left greater than right.  IMPRESSION: 1.  The uterus and ovaries are within normal limits. 2.  Fluid-filled and upper normal caliber bowel loops are noted in the pelvis, of uncertain significance.   Original Report Authenticated By:  Britta Mccreedy, M.D.    US Pelvis Complete  08/17/2012  *RADIOLOGY REPORT*  Clinical Data: Left lower quadrant pain for 1 week.  LMP 07/14/2012  TRANSABDOMINAL AND TRANSVAGINAL ULTRASOUND OF PELVIS Technique:  Both transabdominal and transvaginal ultrasound examinations of the pelvis were performed. Transabdominal technique was performed for global imaging of the pelvis including uterus, ovaries, adnexal regions, and pelvic cul-de-sac.  It was necessary to proceed with endovaginal exam following the transabdominal exam to visualize  the ovaries.  Comparison:  Pelvic ultrasound 04/14/2011  Findings:  Uterus: Normal in size and appearance.  Measures 7.4 x 3.9 x 4.8 cm and is anteverted.  No focal uterine mass.  Endometrium: Measures 12.3 mm in maximal thickness, within normal limits.  Right ovary:  Normal appearance/no adnexal mass.  Measures 3.2 x 1.7 x 2.4 cm.  Left ovary: Normal appearance/no adnexal mass.  Measures 2.5 x 1.1 x 1.6 cm.  Other findings: No free fluid. Fluid-filled, borderline prominent caliber bowel loops are seen in the pelvis, left greater than right.  IMPRESSION: 1.  The uterus and ovaries are within normal limits. 2.  Fluid-filled and upper normal caliber bowel loops are noted in the pelvis, of uncertain significance.   Original Report Authenticated By: Britta Mccreedy, M.D.     Assessment and Plan   1. LLQ abdominal pain   2. UTI (lower urinary tract infection)       Medication List     As of 08/17/2012  6:50 PM    START taking these medications         ibuprofen 800 MG tablet   Commonly known as: ADVIL,MOTRIN   Take 1 tablet (800 mg total) by mouth every 8 (eight) hours as needed for pain.      metroNIDAZOLE 500 MG tablet   Commonly known as: FLAGYL   Take 1 tablet (500 mg total) by mouth 2 (two) times daily.      phenazopyridine 200 MG tablet   Commonly known as: PYRIDIUM   Take 1 tablet (200 mg total) by mouth 3 (three) times daily as needed for pain.       simethicone 125 MG chewable tablet   Commonly known as: MYLICON   Chew 1 tablet (125 mg total) by mouth every 6 (six) hours as needed for flatulence.      sulfamethoxazole-trimethoprim 800-160 MG per tablet   Commonly known as: BACTRIM DS   Take 1 tablet by mouth 2 (two) times daily.      CONTINUE taking these medications         citalopram 20 MG tablet   Commonly known as: CELEXA   Take 1 tablet (20 mg total) by mouth daily. For depression.      QUEtiapine 25 MG tablet   Commonly known as: SEROQUEL          Where to get your medications    These are the prescriptions that you need to pick up. We sent them to a specific pharmacy, so you will need to go there to get them.   [facility] [place], [place] - [id number]    [address] [place] [address]    Phone: [phone]        ibuprofen 800 MG tablet   metroNIDAZOLE 500 MG tablet   phenazopyridine 200 MG tablet   simethicone 125 MG chewable tablet   sulfamethoxazole-trimethoprim 800-160 MG per tablet            Follow-up Information    Follow up with your primary care doctor. (As needed if symptoms worsen or do not improve)            Lacrecia Delval 08/17/2012, 6:50 PM

## 2012-08-17 NOTE — MAU Provider Note (Signed)
Attestation of Attending Supervision of Advanced Practitioner (CNM/NP): Evaluation and management procedures were performed by the Advanced Practitioner under my supervision and collaboration.  I have reviewed the Advanced Practitioner's note and chart, and I agree with the management and plan.  HARRAWAY-SMITH, Kahne Helfand 7:30 PM     

## 2012-08-18 LAB — URINE CULTURE: Colony Count: >=100000

## 2013-05-09 ENCOUNTER — Inpatient Hospital Stay (HOSPITAL_COMMUNITY)
Admission: AD | Admit: 2013-05-09 | Discharge: 2013-05-09 | Disposition: A | Discharge status: Home / Self Care | Payer: Medicaid Other | Source: Ambulatory Visit | Attending: Obstetrics & Gynecology | Admitting: Obstetrics & Gynecology

## 2013-05-09 ENCOUNTER — Encounter (HOSPITAL_COMMUNITY): Payer: Self-pay | Admitting: *Deleted

## 2013-05-09 DIAGNOSIS — B9689 Other specified bacterial agents as the cause of diseases classified elsewhere: Secondary | ICD-10-CM

## 2013-05-09 DIAGNOSIS — N760 Acute vaginitis: Secondary | ICD-10-CM | POA: Insufficient documentation

## 2013-05-09 DIAGNOSIS — R350 Frequency of micturition: Secondary | ICD-10-CM | POA: Insufficient documentation

## 2013-05-09 DIAGNOSIS — A499 Bacterial infection, unspecified: Secondary | ICD-10-CM | POA: Insufficient documentation

## 2013-05-09 DIAGNOSIS — R109 Unspecified abdominal pain: Secondary | ICD-10-CM | POA: Insufficient documentation

## 2013-05-09 LAB — POCT PREGNANCY, URINE: Preg Test, Ur: NEGATIVE

## 2013-05-09 LAB — URINALYSIS, ROUTINE W REFLEX MICROSCOPIC
Leukocytes, UA: NEGATIVE
Nitrite: NEGATIVE
Protein, ur: NEGATIVE mg/dL
Urobilinogen, UA: 1 mg/dL (ref 0.0–1.0)

## 2013-05-09 MED ORDER — METRONIDAZOLE 500 MG PO TABS: 500.0000 mg | ORAL_TABLET | Freq: Three times a day (TID) | ORAL | Status: DC

## 2013-05-09 NOTE — MAU Note (Signed)
Pt states here for lower abd pain that she has had for one month. Denies abnormal vaginal discharge. Having urgency and frequency with voiding, and has odorous urine.

## 2013-05-09 NOTE — MAU Provider Note (Signed)
CC: Urinary Tract Infection and Abdominal Pain    First Provider Initiated Contact with Patient 05/09/13 [phone]      HPI Jeanette Wilson is a 32 y.o. [id number]  who presents with onset 1 month ago of suprapubic intermittent, sharp abdominal pain. For several days she has had urinary frequency and urgency. She denies dysuria or hematuria, but has malodorous urine. Denies irritative vaginal discharge. No new sexual partner. LMP 05/01/13. Denies abnormal bleeding. BTL for contraception.  Past Medical History  Diagnosis Date  . UTI (urinary tract infection) tubiligation  . Depression   . Anxiety   . Schizophrenia     OB History  Gravida Para Term Preterm AB SAB TAB Ectopic Multiple Living  2 2 2  0 0 0 0 0 0 2    # Outcome Date GA Lbr Len/2nd Weight Sex Delivery Anes PTL Lv  2 TRM           1 TRM               Past Surgical History  Procedure Laterality Date  . Tubal ligation    . Wisdom tooth extraction    . Tonsillectomy      History   Social History  . Marital Status: Single    Spouse Name: N/A    Number of Children: N/A  . Years of Education: N/A   Occupational History  . Not on file.   Social History Main Topics  . Smoking status: Passive Smoke Exposure - Never Smoker -- 0.00 packs/day    Types: Cigarettes  . Smokeless tobacco: Current User  . Alcohol Use: Yes     Comment: social drinking  5th of liquor weekly  . Drug Use: 1.00 per week    Special: Marijuana  . Sexual Activity: Yes    Birth Control/ Protection: Surgical   Other Topics Concern  . Not on file   Social History Narrative  . No narrative on file    No current facility-administered medications on file prior to encounter.   Current Outpatient Prescriptions on File Prior to Encounter  Medication Sig Dispense Refill  . citalopram (CELEXA) 20 MG tablet Take 1 tablet (20 mg total) by mouth daily. For depression.  30 tablet  0  . ibuprofen (ADVIL,MOTRIN) 800 MG tablet Take 1 tablet (800 mg total)  by mouth every 8 (eight) hours as needed for pain.  30 tablet  0  . metroNIDAZOLE (FLAGYL) 500 MG tablet Take 1 tablet (500 mg total) by mouth 2 (two) times daily.  14 tablet  0  . phenazopyridine (PYRIDIUM) 200 MG tablet Take 1 tablet (200 mg total) by mouth 3 (three) times daily as needed for pain.  10 tablet  0  . QUEtiapine (SEROQUEL) 25 MG tablet Take 25 mg by mouth daily. And 4 at bed time for anxiety, mood disorder, and insomnia      . simethicone (MYLICON) 125 MG chewable tablet Chew 1 tablet (125 mg total) by mouth every 6 (six) hours as needed for flatulence.  30 tablet  0  . sulfamethoxazole-trimethoprim (BACTRIM DS) 800-160 MG per tablet Take 1 tablet by mouth 2 (two) times daily.  6 tablet  0    No Known Allergies  ROS Pertinent items in HPI  PHYSICAL EXAM Filed Vitals:   05/09/13 0945  BP: 124/81  Pulse: 86  Temp: 98.9 F (37.2 C)  Resp: 18   General: Well nourished, well developed female in no acute distress Cardiovascular: Normal rate Respiratory: Normal  effort Abdomen: Soft, minimally tender suprapubic deep palpation Back: No CVAT Extremities: No edema Neurologic: Alert and oriented Speculum exam: NEFG; vagina with gray bubbly discharge, no blood; cervix clean Bimanual exam: cervix closed, no CMT; uterus NSSP, minimal tenderness with exam; no adnexal tenderness or masses   LAB RESULTS Results for orders placed during the hospital encounter of 05/09/13 (from the past 24 hour(s))  URINALYSIS, ROUTINE W REFLEX MICROSCOPIC     Status: None   Collection Time    05/09/13  9:31 AM      Result Value Range   Color, Urine YELLOW  YELLOW   APPearance CLEAR  CLEAR   Specific Gravity, Urine 1.025  1.005 - 1.030   pH 6.0  5.0 - 8.0   Glucose, UA NEGATIVE  NEGATIVE mg/dL   Hgb urine dipstick NEGATIVE  NEGATIVE   Bilirubin Urine NEGATIVE  NEGATIVE   Ketones, ur NEGATIVE  NEGATIVE mg/dL   Protein, ur NEGATIVE  NEGATIVE mg/dL   Urobilinogen, UA 1.0  0.0 - 1.0 mg/dL    Nitrite NEGATIVE  NEGATIVE   Leukocytes, UA NEGATIVE  NEGATIVE  POCT PREGNANCY, URINE     Status: None   Collection Time    05/09/13  9:50 AM      Result Value Range   Preg Test, Ur NEGATIVE  NEGATIVE  WET PREP, GENITAL     Status: Abnormal   Collection Time    05/09/13 10:25 AM      Result Value Range   Yeast Wet Prep HPF POC NONE SEEN  NONE SEEN   Trich, Wet Prep NONE SEEN  NONE SEEN   Clue Cells Wet Prep HPF POC MODERATE (*) NONE SEEN   WBC, Wet Prep HPF POC FEW (*) NONE SEEN    ASSESSMENT  1. BV (bacterial vaginosis)     PLAN Discharge home. See AVS for patient education.    Medication List         citalopram 20 MG tablet  Commonly known as:  CELEXA  Take 1 tablet (20 mg total) by mouth daily. For depression.     ibuprofen 800 MG tablet  Commonly known as:  ADVIL,MOTRIN  Take 1 tablet (800 mg total) by mouth every 8 (eight) hours as needed for pain.     metroNIDAZOLE 500 MG tablet  Commonly known as:  FLAGYL  Take 1 tablet (500 mg total) by mouth 3 (three) times daily.     QUEtiapine 25 MG tablet  Commonly known as:  SEROQUEL  Take 25 mg by mouth daily. And 4 at bed time for anxiety, mood disorder, and insomnia        Follow-up Information   Schedule an appointment as soon as possible for a visit with [facility] [place]. (Make appointment for contraeption if desired.)    Contact information:   [address] [place] [address] [phone]      Danae Orleans, CNM 05/09/2013 9:53 AM

## 2013-05-09 NOTE — MAU Provider Note (Signed)
Attestation of Attending Supervision of Advanced Practitioner (CNM/NP): Evaluation and management procedures were performed by the Advanced Practitioner under my supervision and collaboration.  I have reviewed the Advanced Practitioner's note and chart, and I agree with the management and plan.  HARRAWAY-SMITH, Olivya Sobol 11:36 AM

## 2013-08-06 ENCOUNTER — Encounter (HOSPITAL_COMMUNITY): Payer: Self-pay | Admitting: Emergency Medicine

## 2013-08-06 ENCOUNTER — Emergency Department (HOSPITAL_COMMUNITY)
Admission: EM | Admit: 2013-08-06 | Discharge: 2013-08-06 | Disposition: A | Discharge status: Home / Self Care | Payer: Medicaid Other | Attending: Emergency Medicine | Admitting: Emergency Medicine

## 2013-08-06 DIAGNOSIS — A499 Bacterial infection, unspecified: Secondary | ICD-10-CM | POA: Insufficient documentation

## 2013-08-06 DIAGNOSIS — Z3202 Encounter for pregnancy test, result negative: Secondary | ICD-10-CM | POA: Insufficient documentation

## 2013-08-06 DIAGNOSIS — Z8619 Personal history of other infectious and parasitic diseases: Secondary | ICD-10-CM | POA: Insufficient documentation

## 2013-08-06 DIAGNOSIS — B9689 Other specified bacterial agents as the cause of diseases classified elsewhere: Secondary | ICD-10-CM | POA: Insufficient documentation

## 2013-08-06 DIAGNOSIS — F209 Schizophrenia, unspecified: Secondary | ICD-10-CM | POA: Insufficient documentation

## 2013-08-06 DIAGNOSIS — N949 Unspecified condition associated with female genital organs and menstrual cycle: Secondary | ICD-10-CM | POA: Insufficient documentation

## 2013-08-06 DIAGNOSIS — F411 Generalized anxiety disorder: Secondary | ICD-10-CM | POA: Insufficient documentation

## 2013-08-06 DIAGNOSIS — F333 Major depressive disorder, recurrent, severe with psychotic symptoms: Secondary | ICD-10-CM | POA: Insufficient documentation

## 2013-08-06 DIAGNOSIS — R45851 Suicidal ideations: Secondary | ICD-10-CM | POA: Insufficient documentation

## 2013-08-06 DIAGNOSIS — Z792 Long term (current) use of antibiotics: Secondary | ICD-10-CM | POA: Insufficient documentation

## 2013-08-06 DIAGNOSIS — N760 Acute vaginitis: Secondary | ICD-10-CM | POA: Insufficient documentation

## 2013-08-06 DIAGNOSIS — N390 Urinary tract infection, site not specified: Secondary | ICD-10-CM | POA: Insufficient documentation

## 2013-08-06 DIAGNOSIS — Z79899 Other long term (current) drug therapy: Secondary | ICD-10-CM | POA: Insufficient documentation

## 2013-08-06 LAB — WET PREP, GENITAL: Trich, Wet Prep: NONE SEEN

## 2013-08-06 LAB — CBC
HCT: 42.7 % (ref 36.0–46.0)
MCV: 94.5 fL (ref 78.0–100.0)
RBC: 4.52 MIL/uL (ref 3.87–5.11)
WBC: 5 10*3/uL (ref 4.0–10.5)

## 2013-08-06 LAB — URINE MICROSCOPIC-ADD ON

## 2013-08-06 LAB — URINALYSIS, ROUTINE W REFLEX MICROSCOPIC
Bilirubin Urine: NEGATIVE
Glucose, UA: NEGATIVE mg/dL
Ketones, ur: NEGATIVE mg/dL
Specific Gravity, Urine: 1.028 (ref 1.005–1.030)
pH: 6 (ref 5.0–8.0)

## 2013-08-06 LAB — ETHANOL: Alcohol, Ethyl (B): <11 mg/dL (ref 0–11)

## 2013-08-06 LAB — BASIC METABOLIC PANEL
BUN: 12 mg/dL (ref 6–23)
CO2: 23 mEq/L (ref 19–32)
Chloride: 103 mEq/L (ref 96–112)
Creatinine, Ser: 0.78 mg/dL (ref 0.50–1.10)
Potassium: 3.5 mEq/L (ref 3.5–5.1)

## 2013-08-06 LAB — POCT PREGNANCY, URINE: Preg Test, Ur: NEGATIVE

## 2013-08-06 LAB — SALICYLATE LEVEL: Salicylate Lvl: <2 mg/dL — ABNORMAL LOW (ref 2.8–20.0)

## 2013-08-06 LAB — ACETAMINOPHEN LEVEL: Acetaminophen (Tylenol), Serum: <15 ug/mL (ref 10–30)

## 2013-08-06 LAB — RAPID URINE DRUG SCREEN, HOSP PERFORMED
Barbiturates: NOT DETECTED
Benzodiazepines: NOT DETECTED

## 2013-08-06 MED ORDER — CEPHALEXIN 500 MG PO CAPS: 500.0000 mg | ORAL_CAPSULE | Freq: Four times a day (QID) | ORAL | Status: DC

## 2013-08-06 MED ORDER — LIDOCAINE HCL (PF) 1 % IJ SOLN
INTRAMUSCULAR | Status: AC
  Filled 2013-08-06: qty 5

## 2013-08-06 MED ORDER — METRONIDAZOLE 500 MG PO TABS: 500.0000 mg | ORAL_TABLET | Freq: Two times a day (BID) | ORAL | Status: DC

## 2013-08-06 MED ORDER — CEFTRIAXONE SODIUM 1 G IJ SOLR
1.0000 g | Freq: Once | INTRAMUSCULAR | Status: AC
  Administered 2013-08-06: 1 g via INTRAMUSCULAR
  Filled 2013-08-06: qty 10

## 2013-08-06 MED ORDER — HYDROCODONE-ACETAMINOPHEN 5-325 MG PO TABS: 1.0000 | ORAL_TABLET | ORAL | Status: DC | PRN

## 2013-08-06 NOTE — ED Notes (Signed)
Pt comfortable with d/c and f/u instructions. Prescriptions x3. Resource guide reviewed with pt as well.

## 2013-08-06 NOTE — ED Notes (Signed)
No change in status, boyfriend at bedside.

## 2013-08-06 NOTE — ED Notes (Signed)
CALLED CARLA WITH [employer] AND SHE WILL TRY TO FIND A SITTER FOR PATIENT

## 2013-08-06 NOTE — ED Notes (Signed)
Patient presents to ed c/o lower abd. Pain with urinary freq. States pain started 8 years ago. States it gets worse with menstrual cycle, denies pain intercourse. lmp 11/24.c/o depression with thoughts of  Suicide states her brother pasted away several years ago. States she is homeless. No plan
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
## 2013-08-06 NOTE — ED Notes (Signed)
CALLED LAB AND ADDED ON LEVELS TO PREVIOUS SAMPLES

## 2013-08-06 NOTE — ED Provider Notes (Signed)
CSN: [id number]     Arrival date & time 08/06/13  0827 History   First MD Initiated Contact with Patient 08/06/13 0830     Chief Complaint  Patient presents with  . Abdominal Pain   (Consider location/radiation/quality/duration/timing/severity/associated sxs/prior Treatment) HPI  This is a 32 year old female who presents emergency Department with multiple complaints.  She has a past medical history of pelvic inflammatory disease, yeast infection, major depressive disorder with psychiatric overtones, schizophrenia.  The patient states that she has had chronic intermittent pelvic pain lasting up to 6 months at a time since the birth of her second child.  She states that the pain can sometimes be excruciating.  She has intermittent sharp stabbing pains, bilateral "ovarian" pain and frequently has difficulty standing straight due to the pain.  The patient complains today that her pain has been constant for the past 6 weeks but worse over the past week.  She has feelings of urinary pressure, dysuria, urgency without urination and production.  Patient discharge and foul odor but she denies dyspareunia, postcoital bleeding. The patient also complains today of severe depression and suicidal ideation.  Dispense been an ongoing problem since the death of her brother in Jan 23, 2009.  She states that she is been homeless for a very long time and is constantly moving from house to house.  She states that the money she gets for disability is not enough to cover her own place.  She has 2 children and feels that she is "a failure as the mother."  The patient is tearful during her explanation.  She complains of wanting to hurt herself.  She has no active plan of suicide, no access to weapons.  Patient has no evidence of self injury.  She does express feelings of hopelessness and anhedonia.  Patient denies homicidal ideation.  She does complain of severe difficulty with sleep as well as audiovisual hallucinations.  Patient states  that she frequently sees and speaks with her brother.  She denies any other hallucinatory symptoms.  He said is currently taking Seroquel and Celexa.  She takes no other medications.  Past Medical History  Diagnosis Date  . UTI (urinary tract infection) tubiligation  . Depression   . Anxiety   . Schizophrenia    Past Surgical History  Procedure Laterality Date  . Tubal ligation    . Wisdom tooth extraction    . Tonsillectomy     Family History  Problem Relation Age of Onset  . Anesthesia problems Neg Hx   . Hypotension Neg Hx   . Malignant hyperthermia Neg Hx   . Pseudochol deficiency Neg Hx    History  Substance Use Topics  . Smoking status: Passive Smoke Exposure - Never Smoker -- 0.00 packs/day    Types: Cigarettes  . Smokeless tobacco: Current User  . Alcohol Use: Yes     Comment: social drinking  5th of liquor weekly   OB History   Grav Para Term Preterm Abortions TAB SAB Ect Mult Living   2 2 2  0 0 0 0 0 0 2     Review of Systems  Ten systems reviewed and are negative for acute change, except as noted in the HPI.   Allergies  Review of patient's allergies indicates no known allergies.  Home Medications   Current Outpatient Rx  Name  Route  Sig  Dispense  Refill  . ibuprofen (ADVIL,MOTRIN) 800 MG tablet   Oral   Take 1 tablet (800 mg total) by mouth every  8 (eight) hours as needed for pain.   30 tablet   0   . EXPIRED: citalopram (CELEXA) 20 MG tablet   Oral   Take 1 tablet (20 mg total) by mouth daily. For depression.   30 tablet   0   . metroNIDAZOLE (FLAGYL) 500 MG tablet   Oral   Take 1 tablet (500 mg total) by mouth 3 (three) times daily.   14 tablet   0   . QUEtiapine (SEROQUEL) 25 MG tablet   Oral   Take 25 mg by mouth daily. And 4 at bed time for anxiety, mood disorder, and insomnia          BP 120/84  Pulse 107  Temp(Src) 98.1 F (36.7 C) (Oral)  Resp 14  SpO2 100%  LMP 07/26/2013 Physical Exam Physical Exam  Nursing note  and vitals reviewed. Constitutional: She is oriented to person, place, and time. She appears well-developed and well-nourished. No distress.  HENT:  Head: Normocephalic and atraumatic.  Eyes: Conjunctivae normal and EOM are normal. Pupils are equal, round, and reactive to light. No scleral icterus.  Neck: Normal range of motion.  Cardiovascular: Normal rate, regular rhythm and normal heart sounds.  Exam reveals no gallop and no friction rub.   No murmur heard. Pulmonary/Chest: Effort normal and breath sounds normal. No respiratory distress.  Abdominal: Soft. Bowel sounds are normal. She exhibits no distension and no mass. TTP Suprapubic and BL Lower quadrants Neurological: She is alert and oriented to person, place, and time.  Skin: Skin is warm and dry. She is not diaphoretic.  GU: Pelvic exam: VULVA: normal appearing vulva with no masses, tenderness or lesions, VAGINA: normal appearing vagina with normal color and discharge no odor, no lesions, CERVIX: normal appearing cervix without discharge or lesions, multiparous os, UTERUS: uterus is normal size, shape, consistency and nontender, ADNEXA: tenderness right, exam chaperoned. Psych: normal speech and thought content. Depressed, flat affect, tearful, slowed. Does not appear delusional or psychotic.  ED Course  Procedures (including critical care time) Labs Review Labs Reviewed  WET PREP, GENITAL - Abnormal; Notable for the following:    Clue Cells Wet Prep HPF POC FEW (*)    WBC, Wet Prep HPF POC MANY (*)    All other components within normal limits  URINALYSIS, ROUTINE W REFLEX MICROSCOPIC - Abnormal; Notable for the following:    APPearance CLOUDY (*)    Nitrite POSITIVE (*)    Leukocytes, UA SMALL (*)    All other components within normal limits  URINE RAPID DRUG SCREEN (HOSP PERFORMED) - Abnormal; Notable for the following:    Tetrahydrocannabinol POSITIVE (*)    All other components within normal limits  SALICYLATE LEVEL -  Abnormal; Notable for the following:    Salicylate Lvl <2.0 (*)    All other components within normal limits  URINE MICROSCOPIC-ADD ON - Abnormal; Notable for the following:    Bacteria, UA MANY (*)    All other components within normal limits  GC/CHLAMYDIA PROBE AMP  URINE CULTURE  CBC  BASIC METABOLIC PANEL  ETHANOL  ACETAMINOPHEN LEVEL  POCT PREGNANCY, URINE   Imaging Review No results found.  EKG Interpretation   None       MDM   1. UTI (lower urinary tract infection)   2. BV (bacterial vaginosis)   3. Major depressive disorder, recurrent, severe with psychotic features   4. Passive suicidal ideations    11:43 AM BP 120/84  Pulse 107  Temp(Src) 98.1  F (36.7 C) (Oral)  Resp 14  SpO2 100%  LMP 07/26/2013 Patien with positive UTI.' I do not feel the patient has PID and warrants treatment at this time . Patient given IM Rocephin 1 g.  She was discharged with prescription for Flagyl.  The patient states to me that she only came in for her abdominal pain.  She only answered questions about as I do to screening questions from nursing staff.  Patient states that she does not wish to be admitted, she does not feel that she is a threat to herself or that she will actually carry out any plans.  She has passive suicidal ideation and strong outpatient followup with her psychiatrist.  The patient verbally contracts for safety with me.  She is with her boyfriend who is present in the rib he states that he did not feel she is a threat to herself as well.  I did have Child psychotherapist consult with the patient regarding her social issues and have given her a list of resources.  I do feel the patient is very low risk for suicidal completion or self-harm. I've given her strong warning precautions to return to the emergency department as well as advised the patient that she must follow up with her psychiatrist as soon as possible regarding her ongoing depression. The patient is safe for  discharge at this time.  Specific return precautions discussed regarding her medical diagnoses.  Arthor Captain, PA-C 08/06/13 1254

## 2013-08-06 NOTE — ED Notes (Signed)
All belongings inventoried and placed in pod C storage Securi ty to wand patient , patient aware of policy. Glassed left with patient ,Patient very tearful about being here and feeling that we would sent her somewhere and her having to leave her kids.Talked with patient and let her know she had come her voluntarily for help and we were here to provide the resources for her and we would tell her everything before we did it.

## 2013-08-06 NOTE — ED Notes (Signed)
Social work at bedside.  

## 2013-08-07 LAB — GC/CHLAMYDIA PROBE AMP
CT Probe RNA: NEGATIVE
GC Probe RNA: NEGATIVE

## 2013-08-08 LAB — URINE CULTURE: Colony Count: >=100000

## 2013-08-16 NOTE — ED Provider Notes (Signed)
Medical screening examination/treatment/procedure(s) were conducted as a shared visit with non-physician practitioner(s) and myself.  I personally evaluated the patient during the encounter.  EKG Interpretation   None      I evaluated this patient. I agree that she needs outpatient referrals for her depression. However she is able to easily contract for safety and agreed to return if any progression of the symptoms to actual suicidal thoughts. I agree that she isappropriate for outpatient treatment I agree with the Arthor Captain' evaluation above.  Roney Marion, MD 08/16/13 [phone]

## 2014-07-04 ENCOUNTER — Encounter (HOSPITAL_COMMUNITY): Payer: Self-pay | Admitting: Emergency Medicine

## 2015-06-30 ENCOUNTER — Encounter: Payer: Self-pay | Admitting: Family Medicine

## 2015-06-30 ENCOUNTER — Ambulatory Visit (INDEPENDENT_AMBULATORY_CARE_PROVIDER_SITE_OTHER): Payer: Medicare Other | Admitting: Family Medicine

## 2015-06-30 VITALS — BP 130/83 | HR 91 | Temp 98.0°F | Ht 63.7 in | Wt 107.0 lb

## 2015-06-30 DIAGNOSIS — Z13220 Encounter for screening for lipoid disorders: Secondary | ICD-10-CM

## 2015-06-30 DIAGNOSIS — F209 Schizophrenia, unspecified: Secondary | ICD-10-CM | POA: Insufficient documentation

## 2015-06-30 DIAGNOSIS — Z113 Encounter for screening for infections with a predominantly sexual mode of transmission: Secondary | ICD-10-CM | POA: Diagnosis not present

## 2015-06-30 DIAGNOSIS — R634 Abnormal weight loss: Secondary | ICD-10-CM

## 2015-06-30 DIAGNOSIS — R1032 Left lower quadrant pain: Secondary | ICD-10-CM | POA: Insufficient documentation

## 2015-06-30 LAB — UA/M W/RFLX CULTURE, ROUTINE
Bilirubin, UA: NEGATIVE
Glucose, UA: NEGATIVE
KETONES UA: NEGATIVE
LEUKOCYTES UA: NEGATIVE
Nitrite, UA: NEGATIVE
PROTEIN UA: NEGATIVE
RBC UA: NEGATIVE
SPEC GRAV UA: 1.025 (ref 1.005–1.030)
Urobilinogen, Ur: 0.2 mg/dL (ref 0.2–1.0)
pH, UA: 6.5 (ref 5.0–7.5)

## 2015-06-30 MED ORDER — QUETIAPINE FUMARATE 25 MG PO TABS: 25.0000 mg | ORAL_TABLET | Freq: Every day | ORAL | Status: DC

## 2015-06-30 NOTE — Assessment & Plan Note (Signed)
Chronic. Has had ultrasound before over 2 years ago. Will check ultrasound again and check blood work. Pelvic and pap to be done next visit at physical.

## 2015-06-30 NOTE — Assessment & Plan Note (Addendum)
Will restart her seroquel at her last dose. Rx sent to her pharmacy. Referral back to psychiatry made today.

## 2015-06-30 NOTE — Progress Notes (Signed)
BP 130/83 mmHg  Pulse 91  Temp(Src) 98 F (36.7 C)  Ht 5' 3.7" (1.618 m)  Wt 107 lb (48.535 kg)  BMI 18.54 kg/m2  LMP 05/25/2015 (Exact Date)   Subjective:    Patient ID: Jeanette Wilson, female    DOB: 01/02/1981, 34 y.o.   MRN: [id number]  HPI: Jeanette Wilson is a 34 y.o. female  Chief Complaint  Patient presents with  . Abdominal Pain  . Schizophrenia    Patient has not had insurance, so she has been off of her medications   Been a long time, seeing mental health rehab [facility] and [facility]- until 2014, then lost her medicaid.  Was on seroquel 35 she thinks. Has been doing OK right now, but would like to get back on her medicine and would like to get back to see her psychiatrist.   ABDOMINAL PAIN- has been having cramps in her belly and feels like she is having contractions right down into her lower belly and into her vagina  Duration: about 10 years Onset: sudden Severity: severe Quality: sharp and stabbing Location:  LLQ  Episode duration: about a minute Radiation: yes into vagina and back Frequency: 2-3x a month Alleviating factors:  Aggravating factors: Status: fluctuating Treatments attempted: ibuprofen and warm water Fever: no Nausea: no Vomiting: no Weight loss: no Decreased appetite: yes Diarrhea: no Constipation: yes Blood in stool: no Heartburn: no Jaundice: no Rash: no Dysuria/urinary frequency: yes only when the pain comes Hematuria: no History of sexually transmitted disease: yes Recurrent NSAID use: no  Relevant past medical, surgical, family and social history reviewed and updated as indicated. Interim medical history since our last visit reviewed. Allergies and medications reviewed and updated.  Review of Systems  Constitutional: Negative.   Respiratory: Negative.   Gastrointestinal: Positive for abdominal pain and constipation. Negative for nausea, vomiting, diarrhea, blood in stool, abdominal distention, anal bleeding and rectal pain.   Genitourinary: Positive for vaginal pain and pelvic pain. Negative for urgency, frequency, hematuria, flank pain, decreased urine volume, vaginal bleeding, vaginal discharge, enuresis, difficulty urinating, genital sores, menstrual problem and dyspareunia.  Psychiatric/Behavioral: Negative.     Per HPI unless specifically indicated above     Objective:    BP 130/83 mmHg  Pulse 91  Temp(Src) 98 F (36.7 C)  Ht 5' 3.7" (1.618 m)  Wt 107 lb (48.535 kg)  BMI 18.54 kg/m2  LMP 05/25/2015 (Exact Date)  Wt Readings from Last 3 Encounters:  06/30/15 107 lb (48.535 kg)  05/09/13 123 lb 6 oz (55.963 kg)  08/17/12 120 lb 3.2 oz (54.522 kg)    Physical Exam  Constitutional: She is oriented to person, place, and time. She appears well-developed and well-nourished. No distress.  HENT:  Head: Normocephalic and atraumatic.  Right Ear: Hearing normal.  Left Ear: Hearing normal.  Nose: Nose normal.  Eyes: Conjunctivae and lids are normal. Right eye exhibits no discharge. Left eye exhibits no discharge. No scleral icterus.  Cardiovascular: Normal rate, regular rhythm, normal heart sounds and intact distal pulses.  Exam reveals no gallop and no friction rub.   No murmur heard. Pulmonary/Chest: Effort normal and breath sounds normal. No respiratory distress. She has no wheezes. She has no rales. She exhibits no tenderness.  Abdominal: Soft. She exhibits no distension and no mass. There is no hepatosplenomegaly, splenomegaly or hepatomegaly. There is tenderness in the suprapubic area, left upper quadrant and left lower quadrant. There is guarding. There is no rebound.  Musculoskeletal: Normal range  of motion.  Neurological: She is alert and oriented to person, place, and time.  Skin: Skin is warm, dry and intact. No rash noted. No erythema. No pallor.  Psychiatric: She has a normal mood and affect. Her speech is normal and behavior is normal. Judgment and thought content normal. Cognition and memory  are normal.  Nursing note and vitals reviewed.      Assessment & Plan:   Problem List Items Addressed This Visit      Other   Schizophrenia (HCC) - Primary    Will restart her seroquel at her last dose. Rx sent to her pharmacy. Referral back to psychiatry made today.       Relevant Orders   Ambulatory referral to Psychiatry   TSH   Left lower quadrant pain    Chronic. Has had ultrasound before over 2 years ago. Will check ultrasound again and check blood work. Pelvic and pap to be done next visit at physical.       Relevant Orders   CBC with Differential/Platelet   UA/M w/rflx Culture, Routine   Comprehensive metabolic panel   US Pelvis Complete   US Transvaginal Non-OB    Other Visit Diagnoses    Weight loss        Will check TSH. Check pap next visit.     Relevant Orders    CBC with Differential/Platelet    TSH    UA/M w/rflx Culture, Routine    Comprehensive metabolic panel    Routine screening for STI (sexually transmitted infection)        Labs checked today. Await results.     Relevant Orders    HIV antibody    RPR    GC/Chlamydia Probe Amp    Hepatitis, Acute    HSV(herpes simplex vrs) 1+2 ab-IgG    Screening for cholesterol level        Drawn today. Await results.     Relevant Orders    Lipid Panel w/o Chol/HDL Ratio        Follow up plan: Return in about 4 weeks (around 07/28/2015) for Physical.

## 2015-06-30 NOTE — Patient Instructions (Signed)

## 2015-07-01 LAB — RPR: RPR Ser Ql: NONREACTIVE

## 2015-07-01 LAB — COMPREHENSIVE METABOLIC PANEL
A/G RATIO: 1.8 (ref 1.1–2.5)
ALK PHOS: 34 IU/L — AB (ref 39–117)
ALT: 12 IU/L (ref 0–32)
AST: 16 IU/L (ref 0–40)
Albumin: 4.6 g/dL (ref 3.5–5.5)
BILIRUBIN TOTAL: 1 mg/dL (ref 0.0–1.2)
BUN/Creatinine Ratio: 19 (ref 8–20)
BUN: 16 mg/dL (ref 6–20)
CALCIUM: 9.6 mg/dL (ref 8.7–10.2)
CHLORIDE: 101 mmol/L (ref 97–106)
CO2: 23 mmol/L (ref 18–29)
Creatinine, Ser: 0.85 mg/dL (ref 0.57–1.00)
GFR calc Af Amer: 105 mL/min/{1.73_m2} (ref >59)
GFR calc non Af Amer: 91 mL/min/{1.73_m2} (ref >59)
GLOBULIN, TOTAL: 2.6 g/dL (ref 1.5–4.5)
Glucose: 62 mg/dL — ABNORMAL LOW (ref 65–99)
Potassium: 4.1 mmol/L (ref 3.5–5.2)
SODIUM: 139 mmol/L (ref 136–144)
Total Protein: 7.2 g/dL (ref 6.0–8.5)

## 2015-07-01 LAB — CBC WITH DIFFERENTIAL/PLATELET
BASOS ABS: 0 10*3/uL (ref 0.0–0.2)
Basos: 0 %
EOS (ABSOLUTE): 0 10*3/uL (ref 0.0–0.4)
EOS: 0 %
HEMATOCRIT: 40.5 % (ref 34.0–46.6)
HEMOGLOBIN: 13.5 g/dL (ref 11.1–15.9)
IMMATURE GRANULOCYTES: 0 %
Immature Grans (Abs): 0 10*3/uL (ref 0.0–0.1)
Lymphocytes Absolute: 2.4 10*3/uL (ref 0.7–3.1)
Lymphs: 49 %
MCH: 31.4 pg (ref 26.6–33.0)
MCHC: 33.3 g/dL (ref 31.5–35.7)
MCV: 94 fL (ref 79–97)
MONOCYTES: 9 %
Monocytes Absolute: 0.4 10*3/uL (ref 0.1–0.9)
Neutrophils Absolute: 2.1 10*3/uL (ref 1.4–7.0)
Neutrophils: 42 %
Platelets: 282 10*3/uL (ref 150–379)
RBC: 4.3 x10E6/uL (ref 3.77–5.28)
RDW: 13.3 % (ref 12.3–15.4)
WBC: 5 10*3/uL (ref 3.4–10.8)

## 2015-07-01 LAB — HEPATITIS PANEL, ACUTE
HEP B S AG: NEGATIVE
Hep A IgM: NEGATIVE
Hep B C IgM: NEGATIVE

## 2015-07-01 LAB — LIPID PANEL W/O CHOL/HDL RATIO
Cholesterol, Total: 179 mg/dL (ref 100–199)
HDL: 76 mg/dL (ref >39)
LDL CALC: 91 mg/dL (ref 0–99)
TRIGLYCERIDES: 60 mg/dL (ref 0–149)
VLDL Cholesterol Cal: 12 mg/dL (ref 5–40)

## 2015-07-01 LAB — HSV(HERPES SIMPLEX VRS) I + II AB-IGG: HSV 1 GLYCOPROTEIN G AB, IGG: 16.9 {index} — AB (ref 0.00–0.90)

## 2015-07-01 LAB — TSH: TSH: 1.19 u[IU]/mL (ref 0.450–4.500)

## 2015-07-01 LAB — HIV ANTIBODY (ROUTINE TESTING W REFLEX): HIV SCREEN 4TH GENERATION: NONREACTIVE

## 2015-07-03 ENCOUNTER — Telehealth: Payer: Self-pay | Admitting: Family Medicine

## 2015-07-03 LAB — GC/CHLAMYDIA PROBE AMP
Chlamydia trachomatis, NAA: NEGATIVE
Neisseria gonorrhoeae by PCR: NEGATIVE

## 2015-07-03 NOTE — Telephone Encounter (Signed)
Called to let patient know that all her blood work came back normal except exposure to cold sores.

## 2015-07-13 ENCOUNTER — Ambulatory Visit (INDEPENDENT_AMBULATORY_CARE_PROVIDER_SITE_OTHER): Payer: Medicare Other | Admitting: Family Medicine

## 2015-07-13 ENCOUNTER — Encounter: Payer: Self-pay | Admitting: Family Medicine

## 2015-07-13 ENCOUNTER — Ambulatory Visit: Payer: Medicaid Other

## 2015-07-13 VITALS — BP 107/72 | HR 91 | Temp 98.0°F | Ht 63.5 in | Wt 111.0 lb

## 2015-07-13 DIAGNOSIS — N760 Acute vaginitis: Secondary | ICD-10-CM

## 2015-07-13 DIAGNOSIS — N898 Other specified noninflammatory disorders of vagina: Secondary | ICD-10-CM | POA: Diagnosis not present

## 2015-07-13 DIAGNOSIS — A499 Bacterial infection, unspecified: Secondary | ICD-10-CM | POA: Diagnosis not present

## 2015-07-13 DIAGNOSIS — Z23 Encounter for immunization: Secondary | ICD-10-CM

## 2015-07-13 DIAGNOSIS — Z124 Encounter for screening for malignant neoplasm of cervix: Secondary | ICD-10-CM

## 2015-07-13 DIAGNOSIS — B9689 Other specified bacterial agents as the cause of diseases classified elsewhere: Secondary | ICD-10-CM

## 2015-07-13 DIAGNOSIS — Z0000 Encounter for general adult medical examination without abnormal findings: Secondary | ICD-10-CM | POA: Diagnosis not present

## 2015-07-13 LAB — WET PREP FOR TRICH, YEAST, CLUE
Clue Cell Exam: POSITIVE — AB
Trichomonas Exam: NEGATIVE
Yeast Exam: NEGATIVE

## 2015-07-13 MED ORDER — METRONIDAZOLE 500 MG PO TABS: 500.0000 mg | ORAL_TABLET | Freq: Two times a day (BID) | ORAL | Status: DC

## 2015-07-13 NOTE — Patient Instructions (Signed)
Preventive Care for Adults, Female A healthy lifestyle and preventive care can promote health and wellness. Preventive health guidelines for women include the following key practices.  A routine yearly physical is a good way to check with your health care provider about your health and preventive screening. It is a chance to share any concerns and updates on your health and to receive a thorough exam.  Visit your dentist for a routine exam and preventive care every 6 months. Brush your teeth twice a day and floss once a day. Good oral hygiene prevents tooth decay and gum disease.  The frequency of eye exams is based on your age, health, family medical history, use of contact lenses, and other factors. Follow your health care provider's recommendations for frequency of eye exams.  Eat a healthy diet. Foods like vegetables, fruits, whole grains, low-fat dairy products, and lean protein foods contain the nutrients you need without too many calories. Decrease your intake of foods high in solid fats, added sugars, and salt. Eat the right amount of calories for you.Get information about a proper diet from your health care provider, if necessary.  Regular physical exercise is one of the most important things you can do for your health. Most adults should get at least 150 minutes of moderate-intensity exercise (any activity that increases your heart rate and causes you to sweat) each week. In addition, most adults need muscle-strengthening exercises on 2 or more days a week.  Maintain a healthy weight. The body mass index (BMI) is a screening tool to identify possible weight problems. It provides an estimate of body fat based on height and weight. Your health care provider can find your BMI and can help you achieve or maintain a healthy weight.For adults 20 years and older:  A BMI below 18.5 is considered underweight.  A BMI of 18.5 to 24.9 is normal.  A BMI of 25 to 29.9 is considered overweight.  A  BMI of 30 and above is considered obese.  Maintain normal blood lipids and cholesterol levels by exercising and minimizing your intake of saturated fat. Eat a balanced diet with plenty of fruit and vegetables. Blood tests for lipids and cholesterol should begin at age 45 and be repeated every 5 years. If your lipid or cholesterol levels are high, you are over 50, or you are at high risk for heart disease, you may need your cholesterol levels checked more frequently.Ongoing high lipid and cholesterol levels should be treated with medicines if diet and exercise are not working.  If you smoke, find out from your health care provider how to quit. If you do not use tobacco, do not start.  Lung cancer screening is recommended for adults aged 45-80 years who are at high risk for developing lung cancer because of a history of smoking. A yearly low-dose CT scan of the lungs is recommended for people who have at least a 30-pack-year history of smoking and are a current smoker or have quit within the past 15 years. A pack year of smoking is smoking an average of 1 pack of cigarettes a day for 1 year (for example: 1 pack a day for 30 years or 2 packs a day for 15 years). Yearly screening should continue until the smoker has stopped smoking for at least 15 years. Yearly screening should be stopped for people who develop a health problem that would prevent them from having lung cancer treatment.  If you are pregnant, do not drink alcohol. If you are  breastfeeding, be very cautious about drinking alcohol. If you are not pregnant and choose to drink alcohol, do not have more than 1 drink per day. One drink is considered to be 12 ounces (355 mL) of beer, 5 ounces (148 mL) of wine, or 1.5 ounces (44 mL) of liquor.  Avoid use of street drugs. Do not share needles with anyone. Ask for help if you need support or instructions about stopping the use of drugs.  High blood pressure causes heart disease and increases the risk  of stroke. Your blood pressure should be checked at least every 1 to 2 years. Ongoing high blood pressure should be treated with medicines if weight loss and exercise do not work.  If you are 55-79 years old, ask your health care provider if you should take aspirin to prevent strokes.  Diabetes screening is done by taking a blood sample to check your blood glucose level after you have not eaten for a certain period of time (fasting). If you are not overweight and you do not have risk factors for diabetes, you should be screened once every 3 years starting at age 45. If you are overweight or obese and you are 40-70 years of age, you should be screened for diabetes every year as part of your cardiovascular risk assessment.  Breast cancer screening is essential preventive care for women. You should practice "breast self-awareness." This means understanding the normal appearance and feel of your breasts and may include breast self-examination. Any changes detected, no matter how small, should be reported to a health care provider. Women in their 20s and 30s should have a clinical breast exam (CBE) by a health care provider as part of a regular health exam every 1 to 3 years. After age 40, women should have a CBE every year. Starting at age 40, women should consider having a mammogram (breast X-ray test) every year. Women who have a family history of breast cancer should talk to their health care provider about genetic screening. Women at a high risk of breast cancer should talk to their health care providers about having an MRI and a mammogram every year.  Breast cancer gene (BRCA)-related cancer risk assessment is recommended for women who have family members with BRCA-related cancers. BRCA-related cancers include breast, ovarian, tubal, and peritoneal cancers. Having family members with these cancers may be associated with an increased risk for harmful changes (mutations) in the breast cancer genes BRCA1 and  BRCA2. Results of the assessment will determine the need for genetic counseling and BRCA1 and BRCA2 testing.  Your health care provider may recommend that you be screened regularly for cancer of the pelvic organs (ovaries, uterus, and vagina). This screening involves a pelvic examination, including checking for microscopic changes to the surface of your cervix (Pap test). You may be encouraged to have this screening done every 3 years, beginning at age 21.  For women ages 30-65, health care providers may recommend pelvic exams and Pap testing every 3 years, or they may recommend the Pap and pelvic exam, combined with testing for human papilloma virus (HPV), every 5 years. Some types of HPV increase your risk of cervical cancer. Testing for HPV may also be done on women of any age with unclear Pap test results.  Other health care providers may not recommend any screening for nonpregnant women who are considered low risk for pelvic cancer and who do not have symptoms. Ask your health care provider if a screening pelvic exam is right for   you.  If you have had past treatment for cervical cancer or a condition that could lead to cancer, you need Pap tests and screening for cancer for at least 20 years after your treatment. If Pap tests have been discontinued, your risk factors (such as having a new sexual partner) need to be reassessed to determine if screening should resume. Some women have medical problems that increase the chance of getting cervical cancer. In these cases, your health care provider may recommend more frequent screening and Pap tests.  Colorectal cancer can be detected and often prevented. Most routine colorectal cancer screening begins at the age of 50 years and continues through age 75 years. However, your health care provider may recommend screening at an earlier age if you have risk factors for colon cancer. On a yearly basis, your health care provider may provide home test kits to check  for hidden blood in the stool. Use of a small camera at the end of a tube, to directly examine the colon (sigmoidoscopy or colonoscopy), can detect the earliest forms of colorectal cancer. Talk to your health care provider about this at age 50, when routine screening begins. Direct exam of the colon should be repeated every 5-10 years through age 75 years, unless early forms of precancerous polyps or small growths are found.  People who are at an increased risk for hepatitis B should be screened for this virus. You are considered at high risk for hepatitis B if:  You were born in a country where hepatitis B occurs often. Talk with your health care provider about which countries are considered high risk.  Your parents were born in a high-risk country and you have not received a shot to protect against hepatitis B (hepatitis B vaccine).  You have HIV or AIDS.  You use needles to inject street drugs.  You live with, or have sex with, someone who has hepatitis B.  You get hemodialysis treatment.  You take certain medicines for conditions like cancer, organ transplantation, and autoimmune conditions.  Hepatitis C blood testing is recommended for all people born from 1945 through 1965 and any individual with known risks for hepatitis C.  Practice safe sex. Use condoms and avoid high-risk sexual practices to reduce the spread of sexually transmitted infections (STIs). STIs include gonorrhea, chlamydia, syphilis, trichomonas, herpes, HPV, and human immunodeficiency virus (HIV). Herpes, HIV, and HPV are viral illnesses that have no cure. They can result in disability, cancer, and death.  You should be screened for sexually transmitted illnesses (STIs) including gonorrhea and chlamydia if:  You are sexually active and are younger than 24 years.  You are older than 24 years and your health care provider tells you that you are at risk for this type of infection.  Your sexual activity has changed  since you were last screened and you are at an increased risk for chlamydia or gonorrhea. Ask your health care provider if you are at risk.  If you are at risk of being infected with HIV, it is recommended that you take a prescription medicine daily to prevent HIV infection. This is called preexposure prophylaxis (PrEP). You are considered at risk if:  You are sexually active and do not regularly use condoms or know the HIV status of your partner(s).  You take drugs by injection.  You are sexually active with a partner who has HIV.  Talk with your health care provider about whether you are at high risk of being infected with HIV. If   you choose to begin PrEP, you should first be tested for HIV. You should then be tested every 3 months for as long as you are taking PrEP.  Osteoporosis is a disease in which the bones lose minerals and strength with aging. This can result in serious bone fractures or breaks. The risk of osteoporosis can be identified using a bone density scan. Women ages 67 years and over and women at risk for fractures or osteoporosis should discuss screening with their health care providers. Ask your health care provider whether you should take a calcium supplement or vitamin D to reduce the rate of osteoporosis.  Menopause can be associated with physical symptoms and risks. Hormone replacement therapy is available to decrease symptoms and risks. You should talk to your health care provider about whether hormone replacement therapy is right for you.  Use sunscreen. Apply sunscreen liberally and repeatedly throughout the day. You should seek shade when your shadow is shorter than you. Protect yourself by wearing long sleeves, pants, a wide-brimmed hat, and sunglasses year round, whenever you are outdoors.  Once a month, do a whole body skin exam, using a mirror to look at the skin on your back. Tell your health care provider of new moles, moles that have irregular borders, moles that  are larger than a pencil eraser, or moles that have changed in shape or color.  Stay current with required vaccines (immunizations).  Influenza vaccine. All adults should be immunized every year.  Tetanus, diphtheria, and acellular pertussis (Td, Tdap) vaccine. Pregnant women should receive 1 dose of Tdap vaccine during each pregnancy. The dose should be obtained regardless of the length of time since the last dose. Immunization is preferred during the 27th-36th week of gestation. An adult who has not previously received Tdap or who does not know her vaccine status should receive 1 dose of Tdap. This initial dose should be followed by tetanus and diphtheria toxoids (Td) booster doses every 10 years. Adults with an unknown or incomplete history of completing a 3-dose immunization series with Td-containing vaccines should begin or complete a primary immunization series including a Tdap dose. Adults should receive a Td booster every 10 years.  Varicella vaccine. An adult without evidence of immunity to varicella should receive 2 doses or a second dose if she has previously received 1 dose. Pregnant females who do not have evidence of immunity should receive the first dose after pregnancy. This first dose should be obtained before leaving the health care facility. The second dose should be obtained 4-8 weeks after the first dose.  Human papillomavirus (HPV) vaccine. Females aged 13-26 years who have not received the vaccine previously should obtain the 3-dose series. The vaccine is not recommended for use in pregnant females. However, pregnancy testing is not needed before receiving a dose. If a female is found to be pregnant after receiving a dose, no treatment is needed. In that case, the remaining doses should be delayed until after the pregnancy. Immunization is recommended for any person with an immunocompromised condition through the age of 61 years if she did not get any or all doses earlier. During the  3-dose series, the second dose should be obtained 4-8 weeks after the first dose. The third dose should be obtained 24 weeks after the first dose and 16 weeks after the second dose.  Zoster vaccine. One dose is recommended for adults aged 30 years or older unless certain conditions are present.  Measles, mumps, and rubella (MMR) vaccine. Adults born  before 1957 generally are considered immune to measles and mumps. Adults born in 1957 or later should have 1 or more doses of MMR vaccine unless there is a contraindication to the vaccine or there is laboratory evidence of immunity to each of the three diseases. A routine second dose of MMR vaccine should be obtained at least 28 days after the first dose for students attending postsecondary schools, health care workers, or international travelers. People who received inactivated measles vaccine or an unknown type of measles vaccine during 1963-1967 should receive 2 doses of MMR vaccine. People who received inactivated mumps vaccine or an unknown type of mumps vaccine before 1979 and are at high risk for mumps infection should consider immunization with 2 doses of MMR vaccine. For females of childbearing age, rubella immunity should be determined. If there is no evidence of immunity, females who are not pregnant should be vaccinated. If there is no evidence of immunity, females who are pregnant should delay immunization until after pregnancy. Unvaccinated health care workers born before 1957 who lack laboratory evidence of measles, mumps, or rubella immunity or laboratory confirmation of disease should consider measles and mumps immunization with 2 doses of MMR vaccine or rubella immunization with 1 dose of MMR vaccine.  Pneumococcal 13-valent conjugate (PCV13) vaccine. When indicated, a person who is uncertain of his immunization history and has no record of immunization should receive the PCV13 vaccine. All adults 65 years of age and older should receive this  vaccine. An adult aged 19 years or older who has certain medical conditions and has not been previously immunized should receive 1 dose of PCV13 vaccine. This PCV13 should be followed with a dose of pneumococcal polysaccharide (PPSV23) vaccine. Adults who are at high risk for pneumococcal disease should obtain the PPSV23 vaccine at least 8 weeks after the dose of PCV13 vaccine. Adults older than 32 years of age who have normal immune system function should obtain the PPSV23 vaccine dose at least 1 year after the dose of PCV13 vaccine.  Pneumococcal polysaccharide (PPSV23) vaccine. When PCV13 is also indicated, PCV13 should be obtained first. All adults aged 65 years and older should be immunized. An adult younger than age 65 years who has certain medical conditions should be immunized. Any person who resides in a nursing home or long-term care facility should be immunized. An adult smoker should be immunized. People with an immunocompromised condition and certain other conditions should receive both PCV13 and PPSV23 vaccines. People with human immunodeficiency virus (HIV) infection should be immunized as soon as possible after diagnosis. Immunization during chemotherapy or radiation therapy should be avoided. Routine use of PPSV23 vaccine is not recommended for American Indians, [place] Natives, or people younger than 65 years unless there are medical conditions that require PPSV23 vaccine. When indicated, people who have unknown immunization and have no record of immunization should receive PPSV23 vaccine. One-time revaccination 5 years after the first dose of PPSV23 is recommended for people aged 19-64 years who have chronic kidney failure, nephrotic syndrome, asplenia, or immunocompromised conditions. People who received 1-2 doses of PPSV23 before age 65 years should receive another dose of PPSV23 vaccine at age 65 years or later if at least 5 years have passed since the previous dose. Doses of PPSV23 are not  needed for people immunized with PPSV23 at or after age 65 years.  Meningococcal vaccine. Adults with asplenia or persistent complement component deficiencies should receive 2 doses of quadrivalent meningococcal conjugate (MenACWY-D) vaccine. The doses should be obtained   at least 2 months apart. Microbiologists working with certain meningococcal bacteria, [place] recruits, people at risk during an outbreak, and people who travel to or live in countries with a high rate of meningitis should be immunized. A first-year college student up through age 34 years who is living in a residence hall should receive a dose if she did not receive a dose on or after her 16th birthday. Adults who have certain high-risk conditions should receive one or more doses of vaccine.  Hepatitis A vaccine. Adults who wish to be protected from this disease, have certain high-risk conditions, work with hepatitis A-infected animals, work in hepatitis A research labs, or travel to or work in countries with a high rate of hepatitis A should be immunized. Adults who were previously unvaccinated and who anticipate close contact with an international adoptee during the first 60 days after arrival in the [place] from a country with a high rate of hepatitis A should be immunized.  Hepatitis B vaccine. Adults who wish to be protected from this disease, have certain high-risk conditions, may be exposed to blood or other infectious body fluids, are household contacts or sex partners of hepatitis B positive people, are clients or workers in certain care facilities, or travel to or work in countries with a high rate of hepatitis B should be immunized.  Haemophilus influenzae type b (Hib) vaccine. A previously unvaccinated person with asplenia or sickle cell disease or having a scheduled splenectomy should receive 1 dose of Hib vaccine. Regardless of previous immunization, a recipient of a hematopoietic stem cell transplant should receive a  3-dose series 6-12 months after her successful transplant. Hib vaccine is not recommended for adults with HIV infection. Preventive Services / Frequency Ages 35 to 4 years  Blood pressure check.** / Every 3-5 years.  Lipid and cholesterol check.** / Every 5 years beginning at age 60.  Clinical breast exam.** / Every 3 years for women in their 71s and 10s.  BRCA-related cancer risk assessment.** / For women who have family members with a BRCA-related cancer (breast, ovarian, tubal, or peritoneal cancers).  Pap test.** / Every 2 years from ages 76 through 26. Every 3 years starting at age 61 through age 76 or 93 with a history of 3 consecutive normal Pap tests.  HPV screening.** / Every 3 years from ages 37 through ages 60 to 51 with a history of 3 consecutive normal Pap tests.  Hepatitis C blood test.** / For any individual with known risks for hepatitis C.  Skin self-exam. / Monthly.  Influenza vaccine. / Every year.  Tetanus, diphtheria, and acellular pertussis (Tdap, Td) vaccine.** / Consult your health care provider. Pregnant women should receive 1 dose of Tdap vaccine during each pregnancy. 1 dose of Td every 10 years.  Varicella vaccine.** / Consult your health care provider. Pregnant females who do not have evidence of immunity should receive the first dose after pregnancy.  HPV vaccine. / 3 doses over 6 months, if 93 and younger. The vaccine is not recommended for use in pregnant females. However, pregnancy testing is not needed before receiving a dose.  Measles, mumps, rubella (MMR) vaccine.** / You need at least 1 dose of MMR if you were born in 1957 or later. You may also need a 2nd dose. For females of childbearing age, rubella immunity should be determined. If there is no evidence of immunity, females who are not pregnant should be vaccinated. If there is no evidence of immunity, females who are  pregnant should delay immunization until after pregnancy.  Pneumococcal  13-valent conjugate (PCV13) vaccine.** / Consult your health care provider.  Pneumococcal polysaccharide (PPSV23) vaccine.** / 1 to 2 doses if you smoke cigarettes or if you have certain conditions.  Meningococcal vaccine.** / 1 dose if you are age 68 to 8 years and a Market researcher living in a residence hall, or have one of several medical conditions, you need to get vaccinated against meningococcal disease. You may also need additional booster doses.  Hepatitis A vaccine.** / Consult your health care provider.  Hepatitis B vaccine.** / Consult your health care provider.  Haemophilus influenzae type b (Hib) vaccine.** / Consult your health care provider. Ages 7 to 53 years  Blood pressure check.** / Every year.  Lipid and cholesterol check.** / Every 5 years beginning at age 25 years.  Lung cancer screening. / Every year if you are aged 11-80 years and have a 30-pack-year history of smoking and currently smoke or have quit within the past 15 years. Yearly screening is stopped once you have quit smoking for at least 15 years or develop a health problem that would prevent you from having lung cancer treatment.  Clinical breast exam.** / Every year after age 48 years.  BRCA-related cancer risk assessment.** / For women who have family members with a BRCA-related cancer (breast, ovarian, tubal, or peritoneal cancers).  Mammogram.** / Every year beginning at age 41 years and continuing for as long as you are in good health. Consult with your health care provider.  Pap test.** / Every 3 years starting at age 65 years through age 37 or 70 years with a history of 3 consecutive normal Pap tests.  HPV screening.** / Every 3 years from ages 72 years through ages 60 to 40 years with a history of 3 consecutive normal Pap tests.  Fecal occult blood test (FOBT) of stool. / Every year beginning at age 21 years and continuing until age 5 years. You may not need to do this test if you get  a colonoscopy every 10 years.  Flexible sigmoidoscopy or colonoscopy.** / Every 5 years for a flexible sigmoidoscopy or every 10 years for a colonoscopy beginning at age 35 years and continuing until age 48 years.  Hepatitis C blood test.** / For all people born from [birth year] through 1965 and any individual with known risks for hepatitis C.  Skin self-exam. / Monthly.  Influenza vaccine. / Every year.  Tetanus, diphtheria, and acellular pertussis (Tdap/Td) vaccine.** / Consult your health care provider. Pregnant women should receive 1 dose of Tdap vaccine during each pregnancy. 1 dose of Td every 10 years.  Varicella vaccine.** / Consult your health care provider. Pregnant females who do not have evidence of immunity should receive the first dose after pregnancy.  Zoster vaccine.** / 1 dose for adults aged 30 years or older.  Measles, mumps, rubella (MMR) vaccine.** / You need at least 1 dose of MMR if you were born in 1957 or later. You may also need a second dose. For females of childbearing age, rubella immunity should be determined. If there is no evidence of immunity, females who are not pregnant should be vaccinated. If there is no evidence of immunity, females who are pregnant should delay immunization until after pregnancy.  Pneumococcal 13-valent conjugate (PCV13) vaccine.** / Consult your health care provider.  Pneumococcal polysaccharide (PPSV23) vaccine.** / 1 to 2 doses if you smoke cigarettes or if you have certain conditions.  Meningococcal vaccine.** /  Consult your health care provider.  Hepatitis A vaccine.** / Consult your health care provider.  Hepatitis B vaccine.** / Consult your health care provider.  Haemophilus influenzae type b (Hib) vaccine.** / Consult your health care provider. Ages 70 years and over  Blood pressure check.** / Every year.  Lipid and cholesterol check.** / Every 5 years beginning at age 9 years.  Lung cancer screening. / Every year if you  are aged 3-80 years and have a 30-pack-year history of smoking and currently smoke or have quit within the past 15 years. Yearly screening is stopped once you have quit smoking for at least 15 years or develop a health problem that would prevent you from having lung cancer treatment.  Clinical breast exam.** / Every year after age 42 years.  BRCA-related cancer risk assessment.** / For women who have family members with a BRCA-related cancer (breast, ovarian, tubal, or peritoneal cancers).  Mammogram.** / Every year beginning at age 49 years and continuing for as long as you are in good health. Consult with your health care provider.  Pap test.** / Every 3 years starting at age 72 years through age 64 or 67 years with 3 consecutive normal Pap tests. Testing can be stopped between 65 and 70 years with 3 consecutive normal Pap tests and no abnormal Pap or HPV tests in the past 10 years.  HPV screening.** / Every 3 years from ages 7 years through ages 57 or 62 years with a history of 3 consecutive normal Pap tests. Testing can be stopped between 65 and 70 years with 3 consecutive normal Pap tests and no abnormal Pap or HPV tests in the past 10 years.  Fecal occult blood test (FOBT) of stool. / Every year beginning at age 67 years and continuing until age 56 years. You may not need to do this test if you get a colonoscopy every 10 years.  Flexible sigmoidoscopy or colonoscopy.** / Every 5 years for a flexible sigmoidoscopy or every 10 years for a colonoscopy beginning at age 36 years and continuing until age 41 years.  Hepatitis C blood test.** / For all people born from [birth year] through 1965 and any individual with known risks for hepatitis C.  Osteoporosis screening.** / A one-time screening for women ages 13 years and over and women at risk for fractures or osteoporosis.  Skin self-exam. / Monthly.  Influenza vaccine. / Every year.  Tetanus, diphtheria, and acellular pertussis (Tdap/Td)  vaccine.** / 1 dose of Td every 10 years.  Varicella vaccine.** / Consult your health care provider.  Zoster vaccine.** / 1 dose for adults aged 45 years or older.  Pneumococcal 13-valent conjugate (PCV13) vaccine.** / Consult your health care provider.  Pneumococcal polysaccharide (PPSV23) vaccine.** / 1 dose for all adults aged 43 years and older.  Meningococcal vaccine.** / Consult your health care provider.  Hepatitis A vaccine.** / Consult your health care provider.  Hepatitis B vaccine.** / Consult your health care provider.  Haemophilus influenzae type b (Hib) vaccine.** / Consult your health care provider. ** Family history and personal history of risk and conditions may change your health care provider's recommendations.   This information is not intended to replace advice given to you by your health care provider. Make sure you discuss any questions you have with your health care provider.   Document Released: 10/15/2001 Document Revised: 09/09/2014 Document Reviewed: 01/14/2011 Elsevier Interactive Patient Education [employer].

## 2015-07-13 NOTE — Progress Notes (Signed)
BP 107/72 mmHg  Pulse 91  Temp(Src) 98 F (36.7 C)  Ht 5' 3.5" (1.613 m)  Wt 111 lb (50.349 kg)  BMI 19.35 kg/m2  SpO2 100%  LMP 05/21/2015   Subjective:    Patient ID: Jeanette Wilson, female    DOB: May 15, 1981, 34 y.o.   MRN: [id number]  HPI: Jeanette Wilson is a 34 y.o. female presenting on 07/13/2015 for comprehensive medical examination. Current medical complaints include: abdominal pain- has not had ultrasound done yet  She currently lives with: her daughters Menopausal Symptoms: no  Depression Screen done today and results listed below:  Depression screen [facility] 2/9 07/13/2015  Decreased Interest 2  Down, Depressed, Hopeless 3  PHQ - 2 Score 5  Altered sleeping 3  Tired, decreased energy 2  Change in appetite 3  Feeling bad or failure about yourself  3  Trouble concentrating 3  Moving slowly or fidgety/restless 3  Suicidal thoughts 3  PHQ-9 Score 25   The patient does not have a history of falls. I did not complete a risk assessment for falls. A plan of care for falls was not documented.  Past Medical History:  Past Medical History  Diagnosis Date  . UTI (urinary tract infection) tubiligation  . Depression   . Anxiety   . Schizophrenia [facility])     Surgical History:  Past Surgical History  Procedure Laterality Date  . Tubal ligation    . Wisdom tooth extraction    . Tonsillectomy      Medications:  Current Outpatient Prescriptions on File Prior to Visit  Medication Sig  . QUEtiapine (SEROQUEL) 25 MG tablet Take 1 tablet (25 mg total) by mouth daily. And 4 at bed time for anxiety, mood disorder, and insomnia   No current facility-administered medications on file prior to visit.    Allergies:  No Known Allergies  Social History:  Social History   Social History  . Marital Status: Single    Spouse Name: N/A  . Number of Children: N/A  . Years of Education: N/A   Occupational History  . Not on file.   Social History Main Topics  . Smoking  status: Current Some Day Smoker -- 0.00 packs/day    Types: Cigarettes  . Smokeless tobacco: Never Used  . Alcohol Use: Yes     Comment: social drinking  5th of liquor weekly  . Drug Use: 1.00 per week    Special: Marijuana  . Sexual Activity: No   Other Topics Concern  . Not on file   Social History Narrative   History  Smoking status  . Current Some Day Smoker -- 0.00 packs/day  . Types: Cigarettes  Smokeless tobacco  . Never Used   History  Alcohol Use  . Yes    Comment: social drinking  5th of liquor weekly    Family History:  Family History  Problem Relation Age of Onset  . Anesthesia problems Neg Hx   . Hypotension Neg Hx   . Malignant hyperthermia Neg Hx   . Pseudochol deficiency Neg Hx   . Cancer Mother   . Hypertension Mother   . Diabetes Mother   . Asthma Sister   . ADD / ADHD Brother   . Schizophrenia Brother   . Bipolar disorder Brother   . Hypertension Brother   . Cirrhosis Brother   . Asthma Daughter   . Diabetes Maternal Grandmother   . Hypertension Maternal Grandmother   . Diabetes Paternal Grandmother   .  Heart disease Paternal Grandmother   . Heart disease Brother     Past medical history, surgical history, medications, allergies, family history and social history reviewed with patient today and changes made to appropriate areas of the chart.   Review of Systems  Constitutional: Positive for chills. Negative for fever, weight loss, malaise/fatigue and diaphoresis.  HENT: Negative.   Eyes: Negative.   Respiratory: Negative.   Cardiovascular: Negative.   Gastrointestinal: Positive for abdominal pain. Negative for nausea, vomiting, diarrhea, constipation, blood in stool and melena.  Genitourinary: Negative.   Musculoskeletal: Positive for myalgias. Negative for back pain, joint pain, falls and neck pain.  Skin: Negative.   Neurological: Positive for tingling. Negative for dizziness, tremors, sensory change, speech change, seizures, loss of  consciousness and weakness.  Endo/Heme/Allergies: Positive for polydipsia. Negative for environmental allergies. Does not bruise/bleed easily.  Psychiatric/Behavioral: Positive for depression. Negative for suicidal ideas, hallucinations, memory loss and substance abuse. The patient is not nervous/anxious and does not have insomnia.     All other ROS negative except what is listed above and in the HPI.      Objective:    BP 107/72 mmHg  Pulse 91  Temp(Src) 98 F (36.7 C)  Ht 5' 3.5" (1.613 m)  Wt 111 lb (50.349 kg)  BMI 19.35 kg/m2  SpO2 100%  LMP 05/21/2015  Wt Readings from Last 3 Encounters:  07/13/15 111 lb (50.349 kg)  06/30/15 107 lb (48.535 kg)  05/09/13 123 lb 6 oz (55.963 kg)    Physical Exam  Constitutional: She is oriented to person, place, and time. She appears well-developed and well-nourished. No distress.  HENT:  Head: Normocephalic and atraumatic.  Right Ear: Hearing normal.  Left Ear: Hearing normal.  Nose: Nose normal.  Eyes: Conjunctivae and lids are normal. Right eye exhibits no discharge. Left eye exhibits no discharge. No scleral icterus.  Cardiovascular: Normal rate, regular rhythm, normal heart sounds and intact distal pulses.  Exam reveals no gallop and no friction rub.   No murmur heard. Pulmonary/Chest: Effort normal and breath sounds normal. No respiratory distress. She has no wheezes. She has no rales. She exhibits no tenderness.  Abdominal: Soft. Bowel sounds are normal. She exhibits no distension and no mass. There is no tenderness. There is no rebound and no guarding. Hernia confirmed negative in the right inguinal area and confirmed negative in the left inguinal area.  Genitourinary: Uterus normal. No breast swelling, tenderness, discharge or bleeding. No labial fusion. There is no rash, tenderness, lesion or injury on the right labia. There is no rash, tenderness, lesion or injury on the left labia. Cervix exhibits discharge. Cervix exhibits no  motion tenderness and no friability. Right adnexum displays no mass, no tenderness and no fullness. Left adnexum displays tenderness. Left adnexum displays no mass and no fullness. No erythema, tenderness or bleeding in the vagina. No foreign body around the vagina. No signs of injury around the vagina. Vaginal discharge found.  Musculoskeletal: Normal range of motion. She exhibits no edema or tenderness.  Lymphadenopathy:       Right: No inguinal adenopathy present.       Left: No inguinal adenopathy present.  Neurological: She is alert and oriented to person, place, and time. She has normal reflexes. She displays normal reflexes. No cranial nerve deficit. She exhibits normal muscle tone. Coordination normal.  Skin: Skin is warm, dry and intact. No rash noted. No erythema. No pallor.  Psychiatric: She has a normal mood and affect. Her speech  is normal and behavior is normal. Judgment and thought content normal. Cognition and memory are normal.  Nursing note and vitals reviewed.   Results for orders placed or performed in visit on 06/30/15  GC/Chlamydia Probe Amp  Result Value Ref Range   Chlamydia trachomatis, NAA Negative Negative   Neisseria gonorrhoeae by PCR Negative Negative  CBC with Differential/Platelet  Result Value Ref Range   WBC 5.0 3.4 - 10.8 x10E3/uL   RBC 4.30 3.77 - 5.28 x10E6/uL   Hemoglobin 13.5 11.1 - 15.9 g/dL   Hematocrit 16.1 09.6 - 46.6 %   MCV 94 79 - 97 fL   MCH 31.4 26.6 - 33.0 pg   MCHC 33.3 31.5 - 35.7 g/dL   RDW 04.5 40.9 - 81.1 %   Platelets 282 150 - 379 x10E3/uL   Neutrophils 42 %   Lymphs 49 %   Monocytes 9 %   Eos 0 %   Basos 0 %   Neutrophils Absolute 2.1 1.4 - 7.0 x10E3/uL   Lymphocytes Absolute 2.4 0.7 - 3.1 x10E3/uL   Monocytes Absolute 0.4 0.1 - 0.9 x10E3/uL   EOS (ABSOLUTE) 0.0 0.0 - 0.4 x10E3/uL   Basophils Absolute 0.0 0.0 - 0.2 x10E3/uL   Immature Granulocytes 0 %   Immature Grans (Abs) 0.0 0.0 - 0.1 x10E3/uL  HIV antibody  Result  Value Ref Range   HIV Screen 4th Generation wRfx Non Reactive Non Reactive  Lipid Panel w/o Chol/HDL Ratio  Result Value Ref Range   Cholesterol, Total 179 100 - 199 mg/dL   Triglycerides 60 0 - 149 mg/dL   HDL 76 >91 mg/dL   VLDL Cholesterol Cal 12 5 - 40 mg/dL   LDL Calculated 91 0 - 99 mg/dL  TSH  Result Value Ref Range   TSH 1.190 0.450 - 4.500 uIU/mL  UA/M w/rflx Culture, Routine  Result Value Ref Range   Specific Gravity, UA 1.025 1.005 - 1.030   pH, UA 6.5 5.0 - 7.5   Color, UA Yellow Yellow   Appearance Ur Clear Clear   Leukocytes, UA Negative Negative   Protein, UA Negative Negative/Trace   Glucose, UA Negative Negative   Ketones, UA Negative Negative   RBC, UA Negative Negative   Bilirubin, UA Negative Negative   Urobilinogen, Ur 0.2 0.2 - 1.0 mg/dL   Nitrite, UA Negative Negative  RPR  Result Value Ref Range   RPR Ser Ql Non Reactive Non Reactive  Hepatitis, Acute  Result Value Ref Range   Hep A IgM Negative Negative   Hepatitis B Surface Ag Negative Negative   Hep B C IgM Negative Negative   Hep C Virus Ab <0.1 0.0 - 0.9 s/co ratio  HSV(herpes simplex vrs) 1+2 ab-IgG  Result Value Ref Range   HSV 1 Glycoprotein G Ab, IgG 16.90 (H) 0.00 - 0.90 index   HSV 2 Glycoprotein G Ab, IgG <0.91 0.00 - 0.90 index  Comprehensive metabolic panel  Result Value Ref Range   Glucose 62 (L) 65 - 99 mg/dL   BUN 16 6 - 20 mg/dL   Creatinine, Ser 4.78 0.57 - 1.00 mg/dL   GFR calc non Af Amer 91 >59 mL/min/1.73   GFR calc Af Amer 105 >59 mL/min/1.73   BUN/Creatinine Ratio 19 8 - 20   Sodium 139 136 - 144 mmol/L   Potassium 4.1 3.5 - 5.2 mmol/L   Chloride 101 97 - 106 mmol/L   CO2 23 18 - 29 mmol/L   Calcium 9.6 8.7 -  10.2 mg/dL   Total Protein 7.2 6.0 - 8.5 g/dL   Albumin 4.6 3.5 - 5.5 g/dL   Globulin, Total 2.6 1.5 - 4.5 g/dL   Albumin/Globulin Ratio 1.8 1.1 - 2.5   Bilirubin Total 1.0 0.0 - 1.2 mg/dL   Alkaline Phosphatase 34 (L) 39 - 117 IU/L   AST 16 0 - 40 IU/L    ALT 12 0 - 32 IU/L      Assessment & Plan:   Problem List Items Addressed This Visit    None    Visit Diagnoses    Routine general medical examination at a health care facility    -  Primary    Up to date on vaccines and screening labs. Continue diet and exercise. Continue to monitor.     Screening for cervical cancer        Pap done today. Await results.     Relevant Orders    IGP, Aptima HPV, rfx 16/18,45    Vaginal discharge        Wet prep checked. +BV- will treat    Relevant Orders    WET PREP FOR TRICH, YEAST, CLUE    Immunization due        Tdap given today    Relevant Orders    Tdap vaccine greater than or equal to 7yo IM    BV (bacterial vaginosis)        Will treat with metronidazole. Call if not getting better.     Relevant Medications    metroNIDAZOLE (FLAGYL) 500 MG tablet        Follow up plan: Return After her ultrasound.   LABORATORY TESTING:  - Pap smear: pap done today  IMMUNIZATIONS:   - Tdap: Tetanus vaccination status reviewed: tetanus status unknown to the patient, Td vaccination indicated and given today. - Influenza: Refused - Pneumovax: Not applicable   PATIENT COUNSELING:   Advised to take 1 mg of folate supplement per day if capable of pregnancy.   Sexuality: Discussed sexually transmitted diseases, partner selection, use of condoms, avoidance of unintended pregnancy  and contraceptive alternatives.   Advised to avoid cigarette smoking.  I discussed with the patient that most people either abstain from alcohol or drink within safe limits (<=14/week and <=4 drinks/occasion for males, <=7/weeks and <= 3 drinks/occasion for females) and that the risk for alcohol disorders and other health effects rises proportionally with the number of drinks per week and how often a drinker exceeds daily limits.  Discussed cessation/primary prevention of drug use and availability of treatment for abuse.   Diet: Encouraged to adjust caloric intake to  maintain  or achieve ideal body weight, to reduce intake of dietary saturated fat and total fat, to limit sodium intake by avoiding high sodium foods and not adding table salt, and to maintain adequate dietary potassium and calcium preferably from fresh fruits, vegetables, and low-fat dairy products.    stressed the importance of regular exercise  Injury prevention: Discussed safety belts, safety helmets, smoke detector, smoking near bedding or upholstery.   Dental health: Discussed importance of regular tooth brushing, flossing, and dental visits.    NEXT PREVENTATIVE PHYSICAL DUE IN 1 YEAR. Return After her ultrasound.

## 2015-07-18 ENCOUNTER — Ambulatory Visit: Payer: Medicaid Other

## 2015-07-20 LAB — IGP, APTIMA HPV, RFX 16/18,45
HPV Aptima: NEGATIVE
PAP Smear Comment: 0

## 2015-07-21 ENCOUNTER — Encounter: Payer: Self-pay | Admitting: Family Medicine

## 2015-07-24 ENCOUNTER — Ambulatory Visit
Admission: RE | Admit: 2015-07-24 | Discharge: 2015-07-24 | Disposition: A | Discharge status: Home / Self Care | Payer: Medicare Other | Source: Ambulatory Visit | Attending: Family Medicine | Admitting: Family Medicine

## 2015-07-24 DIAGNOSIS — N83209 Unspecified ovarian cyst, unspecified side: Secondary | ICD-10-CM | POA: Insufficient documentation

## 2015-07-24 DIAGNOSIS — N83201 Unspecified ovarian cyst, right side: Secondary | ICD-10-CM | POA: Diagnosis not present

## 2015-07-24 DIAGNOSIS — R1032 Left lower quadrant pain: Secondary | ICD-10-CM | POA: Diagnosis not present

## 2015-07-25 ENCOUNTER — Telehealth: Payer: Self-pay | Admitting: Family Medicine

## 2015-07-25 DIAGNOSIS — N83299 Other ovarian cyst, unspecified side: Secondary | ICD-10-CM

## 2015-07-25 NOTE — Telephone Encounter (Signed)
Called to let Jeanette Wilson know that her ultrasound showed that she had a cyst that has burst. They want her to do another ultrasound in 6-12 weeks, the week after her period. Everything else looks nice and normal, nothing to worry about. LMOM for her to call back for results. OK to tell her this if she calls back.

## 2015-07-26 ENCOUNTER — Encounter: Payer: Self-pay | Admitting: Family Medicine

## 2015-07-26 NOTE — Telephone Encounter (Signed)
Gave Jeanette Wilson her results. She will call when she starts her period in about 6 weeks so we can get her scheduled for a repeat ultrasound the following week, per Tiffany Foxx's instructions.

## 2015-07-28 ENCOUNTER — Other Ambulatory Visit: Payer: Self-pay | Admitting: Family Medicine

## 2015-07-28 DIAGNOSIS — N83299 Other ovarian cyst, unspecified side: Secondary | ICD-10-CM

## 2015-09-05 ENCOUNTER — Telehealth: Payer: Self-pay | Admitting: Family Medicine

## 2015-09-05 NOTE — Telephone Encounter (Signed)
-----   Message from Dorcas CarrowMegan P Cindel Daugherty, [place] sent at 07/25/2015  3:18 PM EST ----- Follow up ultrasound

## 2015-09-05 NOTE — Telephone Encounter (Signed)
Patient is due for repeat ultrasound. Orders are in, can we please have imaging call her to get it set up? Thanks!

## 2015-09-06 NOTE — Telephone Encounter (Signed)
Spoke with scheduling, appointment is 09-07-15 @1 :00 [facility]. Patient notified.

## 2015-09-07 ENCOUNTER — Ambulatory Visit: Payer: Medicare Other

## 2015-09-11 ENCOUNTER — Ambulatory Visit: Admission: RE | Admit: 2015-09-11 | Payer: Medicare Other | Source: Ambulatory Visit

## 2015-09-30 ENCOUNTER — Emergency Department
Admission: EM | Admit: 2015-09-30 | Discharge: 2015-09-30 | Disposition: A | Discharge status: Home / Self Care | Payer: Medicare Other | Attending: Emergency Medicine | Admitting: Emergency Medicine

## 2015-09-30 DIAGNOSIS — Z792 Long term (current) use of antibiotics: Secondary | ICD-10-CM | POA: Diagnosis not present

## 2015-09-30 DIAGNOSIS — R51 Headache: Secondary | ICD-10-CM | POA: Insufficient documentation

## 2015-09-30 DIAGNOSIS — H53142 Visual discomfort, left eye: Secondary | ICD-10-CM | POA: Insufficient documentation

## 2015-09-30 DIAGNOSIS — F17210 Nicotine dependence, cigarettes, uncomplicated: Secondary | ICD-10-CM | POA: Insufficient documentation

## 2015-09-30 DIAGNOSIS — H5712 Ocular pain, left eye: Secondary | ICD-10-CM | POA: Diagnosis not present

## 2015-09-30 DIAGNOSIS — R519 Headache, unspecified: Secondary | ICD-10-CM

## 2015-09-30 MED ORDER — SUMATRIPTAN SUCCINATE 6 MG/0.5ML ~~LOC~~ SOLN
6.0000 mg | Freq: Once | SUBCUTANEOUS | Status: AC
  Administered 2015-09-30: 6 mg via SUBCUTANEOUS
  Filled 2015-09-30: qty 0.5

## 2015-09-30 MED ORDER — METOCLOPRAMIDE HCL 10 MG PO TABS
10.0000 mg | ORAL_TABLET | Freq: Once | ORAL | Status: AC
  Administered 2015-09-30: 10 mg via ORAL
  Filled 2015-09-30: qty 1

## 2015-09-30 MED ORDER — KETOROLAC TROMETHAMINE 10 MG PO TABS: 10.0000 mg | ORAL_TABLET | Freq: Three times a day (TID) | ORAL | Status: DC

## 2015-09-30 MED ORDER — METOCLOPRAMIDE HCL 5 MG PO TABS: 5.0000 mg | ORAL_TABLET | Freq: Three times a day (TID) | ORAL | Status: DC | PRN

## 2015-09-30 MED ORDER — KETOROLAC TROMETHAMINE 60 MG/2ML IM SOLN
60.0000 mg | Freq: Once | INTRAMUSCULAR | Status: AC
  Administered 2015-09-30: 60 mg via INTRAMUSCULAR
  Filled 2015-09-30: qty 2

## 2015-09-30 NOTE — ED Notes (Signed)
Pt reports Headache for past 4 days. She went to [facility] today and took her BP and it was 131/94. Pt has a history of migraines denies taking any medication today. C/o pain left eye and light sensitivity  and nausea.

## 2015-09-30 NOTE — ED Provider Notes (Signed)
[facility] Emergency Department Provider Note ____________________________________________  Time seen: 1456  I have reviewed the triage vital signs and the nursing notes.  HISTORY  Chief Complaint  Migraine  HPI Jeanette Wilson is a 35 y.o. female resistance to the ED for evaluation ofheadache for the past 4 days. She went to write a today and took her blood pressure due to some left eye pain and sensitivity. She reports a blood pressure there of 131/94. She denies a history of hypertension or prior treatment. She is describes the headache as consistent with her typical headache that occurs about every 2 months. The duration is usually somewhere between 2-3 days. She describes the head pain at about a 9/10. The discomfort to her left eye about 8/10. She describes spots the left eye that come and go but denies any visual loss, eye drainage, dizziness, focal weakness. She does admit to some sinus pressure and runny nose overall. She also notes some nausea without vomiting. She is not taking any medication for her current headache presentation. She locates the pain to the back of the head and across the left side of the head. Typically she'll dosing Excedrin with good relief of her symptoms.   Past Medical History  Diagnosis Date  . UTI (urinary tract infection) tubiligation  . Depression   . Anxiety   . Schizophrenia [facility])     Patient Active Problem List   Diagnosis Date Noted  . 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 Schizophrenia (HCC) 06/30/2015  . Left lower quadrant pain 06/30/2015  . 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 Generalized anxiety disorder 11/27/2011    Class: Acute    Past Surgical History  Procedure Laterality Date  . Tubal ligation    . Wisdom tooth extraction    . Tonsillectomy      Current Outpatient Rx  Name  Route  Sig  Dispense  Refill  . ketorolac (TORADOL) 10 MG  tablet   Oral   Take 1 tablet (10 mg total) by mouth every 8 (eight) hours.   15 tablet   0   . metoCLOPramide (REGLAN) 5 MG tablet   Oral   Take 1 tablet (5 mg total) by mouth every 8 (eight) hours as needed for nausea or vomiting.   15 tablet   0   . metroNIDAZOLE (FLAGYL) 500 MG tablet   Oral   Take 1 tablet (500 mg total) by mouth 2 (two) times daily.   14 tablet   0   . QUEtiapine (SEROQUEL) 25 MG tablet   Oral   Take 1 tablet (25 mg total) by mouth daily. And 4 at bed time for anxiety, mood disorder, and insomnia   150 tablet   1    Allergies Review of patient's allergies indicates no known allergies.  Family History  Problem Relation Age of Onset  . Anesthesia problems Neg Hx   . Hypotension Neg Hx   . Malignant hyperthermia Neg Hx   . Pseudochol deficiency Neg Hx   . Cancer Mother   . Hypertension Mother   . Diabetes Mother   . Asthma Sister   . ADD / ADHD Brother   . Schizophrenia Brother   . Bipolar disorder Brother   . Hypertension Brother   . Cirrhosis Brother   . Asthma Daughter   . Diabetes Maternal Grandmother   . Hypertension Maternal Grandmother   . Diabetes Paternal Grandmother   .  Heart disease Paternal Grandmother   . Heart disease Brother     Social History Social History  Substance Use Topics  . Smoking status: Current Some Day Smoker -- 0.00 packs/day    Types: Cigarettes  . Smokeless tobacco: Never Used  . Alcohol Use: Yes     Comment: social drinking  5th of liquor weekly   Review of Systems  Constitutional: Negative for fever. Eyes: Negative for visual changes. Reports left eye light sensitivity and pain. ENT: Negative for sore throat. Cardiovascular: Negative for chest pain. Respiratory: Negative for shortness of breath. Gastrointestinal: Negative for abdominal pain, vomiting and diarrhea. Genitourinary: Negative for dysuria. Musculoskeletal: Negative for back pain. Skin: Negative for rash. Neurological: Negative for  focal weakness or numbness. Reports headache as above. ____________________________________________  PHYSICAL EXAM:  VITAL SIGNS: ED Triage Vitals  Enc Vitals Group     BP 09/30/15 1415 133/90 mmHg     Pulse Rate 09/30/15 1415 88     Resp 09/30/15 1415 16     Temp 09/30/15 1415 99 F (37.2 C)     Temp Source 09/30/15 1415 Oral     SpO2 09/30/15 1415 100 %     Weight 09/30/15 1415 111 lb (50.349 kg)     Height 09/30/15 1415  (1.6 m)     Head Cir --      Peak Flow --      Pain Score 09/30/15 1419 8     Pain Loc --      Pain Edu? --      Excl. in GC? --    Constitutional: Alert and oriented. Well appearing and in no distress. Head: Normocephalic and atraumatic.      Eyes: Conjunctivae are normal. PERRL. Normal extraocular movements. Normal fundi bilaterally. No papilledema, floater, or vascular changes.       Ears: Canals clear. TMs intact bilaterally.   Nose: No congestion/rhinorrhea.   Mouth/Throat: Mucous membranes are moist.   Neck: Supple. No thyromegaly. Hematological/Lymphatic/Immunological: No cervical lymphadenopathy. Cardiovascular: Normal rate, regular rhythm.  Respiratory: Normal respiratory effort. No wheezes/rales/rhonchi. Gastrointestinal: Soft and nontender. No distention. Musculoskeletal: Nontender with normal range of motion in all extremities.  Neurologic: CN II-XII grossly intact. Normal gait without ataxia. Normal speech and language. No gross focal neurologic deficits are appreciated. Skin:  Skin is warm, dry and intact. No rash noted. Psychiatric: Mood and affect are normal. Patient exhibits appropriate insight and judgment. ____________________________________________  PROCEDURES  Toradol 60 mg IM Imitrex 6 mg SQ Reglan 10 mg PO ____________________________________________  INITIAL IMPRESSION / ASSESSMENT AND PLAN / ED COURSE  Patient with headache that seems to be responding well to ED treatment. She reports pain at a 3/10 at time  of disposition. She is encouraged to continue to monitor blood pressure and follow-up with primary care provider. She'll be released with a prescription for ketorolac and Reglan a dose as needed for headache pain and nausea. She return to the ED for acutely worsening symptoms. ____________________________________________  FINAL CLINICAL IMPRESSION(S) / ED DIAGNOSES  Final diagnoses:  Acute nonintractable headache, unspecified headache type      Lissa Hoard, PA-C 09/30/15 1657  Governor Rooks, MD 10/01/15 1914

## 2015-09-30 NOTE — Discharge Instructions (Signed)
General Headache Without Cause A headache is pain or discomfort felt around the head or neck area. The specific cause of a headache may not be found. There are many causes and types of headaches. A few common ones are:  Tension headaches.  Migraine headaches.  Cluster headaches.  Chronic daily headaches. HOME CARE INSTRUCTIONS  Watch your condition for any changes. Take these steps to help with your condition: Managing Pain  Take over-the-counter and prescription medicines only as told by your health care provider.  Lie down in a dark, quiet room when you have a headache.  If directed, apply ice to the head and neck area:  Put ice in a plastic bag.  Place a towel between your skin and the bag.  Leave the ice on for 20 minutes, 2-3 times per day.  Use a heating pad or hot shower to apply heat to the head and neck area as told by your health care provider.  Keep lights dim if bright lights bother you or make your headaches worse. Eating and Drinking  Eat meals on a regular schedule.  Limit alcohol use.  Decrease the amount of caffeine you drink, or stop drinking caffeine. General Instructions  Keep all follow-up visits as told by your health care provider. This is important.  Keep a headache journal to help find out what may trigger your headaches. For example, write down:  What you eat and drink.  How much sleep you get.  Any change to your diet or medicines.  Try massage or other relaxation techniques.  Limit stress.  Sit up straight, and do not tense your muscles.  Do not use tobacco products, including cigarettes, chewing tobacco, or e-cigarettes. If you need help quitting, ask your health care provider.  Exercise regularly as told by your health care provider.  Sleep on a regular schedule. Get 7-9 hours of sleep, or the amount recommended by your health care provider. SEEK MEDICAL CARE IF:   Your symptoms are not helped by medicine.  You have a  headache that is different from the usual headache.  You have nausea or you vomit.  You have a fever. SEEK IMMEDIATE MEDICAL CARE IF:   Your headache becomes severe.  You have repeated vomiting.  You have a stiff neck.  You have a loss of vision.  You have problems with speech.  You have pain in the eye or ear.  You have muscular weakness or loss of muscle control.  You lose your balance or have trouble walking.  You feel faint or pass out.  You have confusion.   This information is not intended to replace advice given to you by your health care provider. Make sure you discuss any questions you have with your health care provider.   Document Released: 08/19/2005 Document Revised: 05/10/2015 Document Reviewed: 12/12/2014 Elsevier Interactive Patient Education [employer]   Your exam and blood pressure were essentially normal today. You should continue to monitor your blood pressures. Take the prescription meds as directed. Follow-up with your provider for ongoing symptoms.

## 2015-12-12 ENCOUNTER — Telehealth: Payer: Self-pay | Admitting: Family Medicine

## 2015-12-12 NOTE — Telephone Encounter (Signed)
erroneous

## 2015-12-26 ENCOUNTER — Encounter: Payer: Self-pay | Admitting: *Deleted

## 2015-12-26 ENCOUNTER — Encounter: Payer: Self-pay | Admitting: Family Medicine

## 2015-12-26 ENCOUNTER — Emergency Department
Admission: EM | Admit: 2015-12-26 | Discharge: 2015-12-26 | Disposition: A | Discharge status: Home / Self Care | Payer: Medicare Other | Attending: Emergency Medicine | Admitting: Emergency Medicine

## 2015-12-26 DIAGNOSIS — F17200 Nicotine dependence, unspecified, uncomplicated: Secondary | ICD-10-CM | POA: Diagnosis not present

## 2015-12-26 DIAGNOSIS — F209 Schizophrenia, unspecified: Secondary | ICD-10-CM | POA: Insufficient documentation

## 2015-12-26 DIAGNOSIS — N760 Acute vaginitis: Secondary | ICD-10-CM | POA: Diagnosis not present

## 2015-12-26 DIAGNOSIS — F329 Major depressive disorder, single episode, unspecified: Secondary | ICD-10-CM | POA: Diagnosis not present

## 2015-12-26 DIAGNOSIS — F1210 Cannabis abuse, uncomplicated: Secondary | ICD-10-CM | POA: Diagnosis not present

## 2015-12-26 DIAGNOSIS — R300 Dysuria: Secondary | ICD-10-CM | POA: Diagnosis present

## 2015-12-26 LAB — POCT PREGNANCY, URINE: Preg Test, Ur: NEGATIVE

## 2015-12-26 LAB — CBC
HEMATOCRIT: 39.5 % (ref 35.0–47.0)
HEMOGLOBIN: 13.4 g/dL (ref 12.0–16.0)
MCH: 31.6 pg (ref 26.0–34.0)
MCHC: 34 g/dL (ref 32.0–36.0)
MCV: 93.1 fL (ref 80.0–100.0)
Platelets: 261 10*3/uL (ref 150–440)
RBC: 4.24 MIL/uL (ref 3.80–5.20)
RDW: 13.4 % (ref 11.5–14.5)
WBC: 4.3 10*3/uL (ref 3.6–11.0)

## 2015-12-26 LAB — WET PREP, GENITAL
SPERM: NONE SEEN
TRICH WET PREP: NONE SEEN
YEAST WET PREP: NONE SEEN

## 2015-12-26 LAB — BASIC METABOLIC PANEL
ANION GAP: 10 (ref 5–15)
BUN: 11 mg/dL (ref 6–20)
CALCIUM: 9.5 mg/dL (ref 8.9–10.3)
CHLORIDE: 105 mmol/L (ref 101–111)
CO2: 22 mmol/L (ref 22–32)
Creatinine, Ser: 0.75 mg/dL (ref 0.44–1.00)
GFR calc non Af Amer: >60 mL/min (ref >60)
GLUCOSE: 94 mg/dL (ref 65–99)
POTASSIUM: 4 mmol/L (ref 3.5–5.1)
Sodium: 137 mmol/L (ref 135–145)

## 2015-12-26 LAB — URINALYSIS COMPLETE WITH MICROSCOPIC (ARMC ONLY)
Bilirubin Urine: NEGATIVE
GLUCOSE, UA: NEGATIVE mg/dL
Hgb urine dipstick: NEGATIVE
Ketones, ur: NEGATIVE mg/dL
Leukocytes, UA: NEGATIVE
NITRITE: NEGATIVE
PROTEIN: NEGATIVE mg/dL
SPECIFIC GRAVITY, URINE: 1.012 (ref 1.005–1.030)
pH: 7 (ref 5.0–8.0)

## 2015-12-26 LAB — CHLAMYDIA/NGC RT PCR (ARMC ONLY)
Chlamydia Tr: NOT DETECTED
N gonorrhoeae: NOT DETECTED

## 2015-12-26 MED ORDER — METRONIDAZOLE 500 MG PO TABS: 500.0000 mg | ORAL_TABLET | Freq: Two times a day (BID) | ORAL | Status: DC

## 2015-12-26 NOTE — ED Notes (Signed)
States yellow green vaginal discharge and lower abd cramping for 1 week, states foul smelling urine and burning

## 2015-12-26 NOTE — ED Provider Notes (Signed)
[facility] Emergency Department Provider Note  ____________________________________________  Time seen: Approximately 11:25 AM   I have reviewed the triage vital signs and the nursing notes.   HISTORY  Chief Complaint Vaginal Discharge and Dysuria    HPI Jeanette Wilson is a 35 y.o. female who presents to the emergency department for evaluation of vaginal discharge, dysuria, foul-smelling urine, and abdominal cramping that is similar to her premenstrual syndrome. She denies recent exposure to STDs that she knows of. She also states that she had one episode of vomiting last night. She has been able tolerate food and fluids today without vomiting.  History reviewed. No pertinent past medical history.  There are no active problems to display for this patient.   Past Surgical History  Procedure Laterality Date  . Tubal ligation      Current Outpatient Rx  Name  Route  Sig  Dispense  Refill  . metroNIDAZOLE (FLAGYL) 500 MG tablet   Oral   Take 1 tablet (500 mg total) by mouth 2 (two) times daily.   14 tablet   0     Allergies Review of patient's allergies indicates no known allergies.  History reviewed. No pertinent family history.  Social History Social History  Substance Use Topics  . Smoking status: Current Every Day Smoker  . Smokeless tobacco: None  . Alcohol Use: None    Review of Systems Constitutional: No fever/chills Cardiovascular: Denies chest pain. Respiratory: Denies shortness of breath or cough. Gastrointestinal: Abdominal pain yes, suprapubic., nausea no, vomiting last p.m., none today. Genitourinary: Dysuria yes, vaginal discharge yes.. Musculoskeletal: Negative for back pain. Skin: Negative for rash. Neurological: Negative for headaches, focal weakness or numbness.  10-point ROS otherwise unremarkable.  ____________________________________________   PHYSICAL EXAM:  VITAL SIGNS: ED Triage Vitals  Enc Vitals Group      BP 12/26/15 1024 124/84 mmHg     Pulse Rate 12/26/15 1024 86     Resp 12/26/15 1024 18     Temp 12/26/15 1024 98.3 F (36.8 C)     Temp Source 12/26/15 1024 Oral     SpO2 12/26/15 1024 100 %     Weight 12/26/15 1024 128 lb (58.06 kg)     Height 12/26/15 1024  (1.575 m)     Head Cir --      Peak Flow --      Pain Score 12/26/15 1025 6     Pain Loc --      Pain Edu? --      Excl. in GC? --     Constitutional: Alert and oriented. Well appearing and in no acute distress. Eyes: Conjunctivae are normal. PERRL. EOMI. Head: Atraumatic. Nose: No congestion/rhinnorhea. Mouth/Throat: Mucous membranes are moist. Neck: No stridor. Cardiovascular: Good peripheral circulation. Respiratory: Normal respiratory effort.  No retractions. Gastrointestinal: Soft and nontender. No distention. No abdominal bruits. Genitourinary: Pelvic exam: White discharge present in the vaginal vault, cervical os is closed and cervix is not friable, there is no discharge noted at the cervical os. The cervix does appear to have a strawberry spots near the os. It is no cervical motion tenderness. There is no adnexal tenderness. Musculoskeletal: No extremity tenderness nor edema.  Neurologic:  Normal speech and language. No gross focal neurologic deficits are appreciated. Speech is normal. No gait instability. Skin:  Skin is warm, dry and intact. No rash noted. Psychiatric: Mood and affect are normal. Speech and behavior are normal.  ____________________________________________   LABS (all labs ordered are  listed, but only abnormal results are displayed)  Labs Reviewed  WET PREP, GENITAL - Abnormal; Notable for the following:    Clue Cells Wet Prep HPF POC FEW (*)    WBC, Wet Prep HPF POC FEW (*)    All other components within normal limits  URINALYSIS COMPLETEWITH MICROSCOPIC ([facility] ONLY) - Abnormal; Notable for the following:    Color, Urine STRAW (*)    APPearance CLEAR (*)    Bacteria, UA RARE (*)     Squamous Epithelial / LPF 0-5 (*)    All other components within normal limits  CHLAMYDIA/NGC RT PCR ([facility] ONLY)  CBC  BASIC METABOLIC PANEL  POC URINE PREG, ED  POCT PREGNANCY, URINE   ____________________________________________  RADIOLOGY  Not indicated ____________________________________________   PROCEDURES  Procedure(s) performed: See pelvic exam as described above.  ____________________________________________   INITIAL IMPRESSION / ASSESSMENT AND PLAN / ED COURSE  Pertinent labs & imaging results that were available during my care of the patient were reviewed by me and considered in my medical decision making (see chart for details).  Patient will be treated with 7 days of Flagyl. She was instructed to avoid alcohol beverages until completed. [facility] symptoms that are not improving with medication or for new or recurrent symptoms. ____________________________________________   FINAL CLINICAL IMPRESSION(S) / ED DIAGNOSES  Final diagnoses:  Vaginitis       Chinita PesterCari B Benett Swoyer, FNP 12/26/15 1330  Jennye MoccasinBrian S Quigley, MD 12/26/15 1339

## 2015-12-26 NOTE — ED Notes (Signed)
Pt discharged home after verbalizing understanding of discharge instructions; nad noted. 

## 2015-12-26 NOTE — ED Notes (Signed)
Pt presents with vaginal discharge and painful urination x 1 week. Pt also reports that she had acid and vomiting at work today, as well as pain in lower abdomen. Pt alert & oriented with NAD noted.

## 2015-12-26 NOTE — Discharge Instructions (Signed)

## 2016-02-21 ENCOUNTER — Encounter: Payer: Self-pay | Admitting: Emergency Medicine

## 2016-02-21 ENCOUNTER — Emergency Department: Payer: Medicare Other

## 2016-02-21 ENCOUNTER — Emergency Department
Admission: EM | Admit: 2016-02-21 | Discharge: 2016-02-21 | Disposition: A | Discharge status: Home / Self Care | Payer: Medicare Other | Attending: Emergency Medicine | Admitting: Emergency Medicine

## 2016-02-21 DIAGNOSIS — M549 Dorsalgia, unspecified: Secondary | ICD-10-CM | POA: Diagnosis not present

## 2016-02-21 DIAGNOSIS — F333 Major depressive disorder, recurrent, severe with psychotic symptoms: Secondary | ICD-10-CM | POA: Insufficient documentation

## 2016-02-21 DIAGNOSIS — F209 Schizophrenia, unspecified: Secondary | ICD-10-CM | POA: Diagnosis not present

## 2016-02-21 DIAGNOSIS — F17200 Nicotine dependence, unspecified, uncomplicated: Secondary | ICD-10-CM | POA: Diagnosis not present

## 2016-02-21 DIAGNOSIS — F1290 Cannabis use, unspecified, uncomplicated: Secondary | ICD-10-CM | POA: Diagnosis not present

## 2016-02-21 DIAGNOSIS — N200 Calculus of kidney: Secondary | ICD-10-CM

## 2016-02-21 DIAGNOSIS — R109 Unspecified abdominal pain: Secondary | ICD-10-CM | POA: Diagnosis not present

## 2016-02-21 LAB — URINALYSIS COMPLETE WITH MICROSCOPIC (ARMC ONLY)
BILIRUBIN URINE: NEGATIVE
Bacteria, UA: NONE SEEN
GLUCOSE, UA: NEGATIVE mg/dL
Hgb urine dipstick: NEGATIVE
LEUKOCYTES UA: NEGATIVE
NITRITE: NEGATIVE
Protein, ur: NEGATIVE mg/dL
RBC / HPF: NONE SEEN RBC/hpf (ref 0–5)
SPECIFIC GRAVITY, URINE: 1.032 — AB (ref 1.005–1.030)
pH: 5 (ref 5.0–8.0)

## 2016-02-21 MED ORDER — KETOROLAC TROMETHAMINE 10 MG PO TABS: 10.0000 mg | ORAL_TABLET | Freq: Three times a day (TID) | ORAL | Status: DC | PRN

## 2016-02-21 NOTE — ED Notes (Signed)
Pt arrive dot the ED via [facility] from work [employer] for complaints  Of left flank pain. Pt states that she feels like she has a full bladder but urinates very little. Pt also reports having ovarian cysts. Pt is AOx4 in no apparent distress.

## 2016-02-21 NOTE — ED Provider Notes (Signed)
[facility] Emergency Department Provider Note  ____________________________________________  Time seen: 5:00 AM  I have reviewed the triage vital signs and the nursing notes.   HISTORY  Chief Complaint Flank Pain      HPI Jeanette Wilson is a 35 y.o. female presents with acute onset of left flank pain approximately 10 PM last night. Patient denies any dysuria but does admit to urinary frequency and urgency. Patient denies any fever. Patient denies any vomiting or diarrhea.     Past Medical History  Diagnosis Date  . UTI (urinary tract infection) tubiligation  . Depression   . Anxiety   . Schizophrenia [facility])     Patient Active Problem List   Diagnosis Date Noted  . 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 Schizophrenia (HCC) 06/30/2015  . Left lower quadrant pain 06/30/2015  . 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 Generalized anxiety disorder 11/27/2011    Class: Acute    Past Surgical History  Procedure Laterality Date  . Wisdom tooth extraction    . Tonsillectomy    . Tubal ligation      Current Outpatient Rx  Name  Route  Sig  Dispense  Refill  . ketorolac (TORADOL) 10 MG tablet   Oral   Take 1 tablet (10 mg total) by mouth every 8 (eight) hours.   15 tablet   0   . metoCLOPramide (REGLAN) 5 MG tablet   Oral   Take 1 tablet (5 mg total) by mouth every 8 (eight) hours as needed for nausea or vomiting.   15 tablet   0   . metroNIDAZOLE (FLAGYL) 500 MG tablet   Oral   Take 1 tablet (500 mg total) by mouth 2 (two) times daily.   14 tablet   0   . metroNIDAZOLE (FLAGYL) 500 MG tablet   Oral   Take 1 tablet (500 mg total) by mouth 2 (two) times daily.   14 tablet   0   . QUEtiapine (SEROQUEL) 25 MG tablet   Oral   Take 1 tablet (25 mg total) by mouth daily. And 4 at bed time for anxiety, mood disorder, and insomnia   150 tablet   1     Allergies Wheat  bran  Family History  Problem Relation Age of Onset  . Anesthesia problems Neg Hx   . Hypotension Neg Hx   . Malignant hyperthermia Neg Hx   . Pseudochol deficiency Neg Hx   . Cancer Mother   . Hypertension Mother   . Diabetes Mother   . Asthma Sister   . ADD / ADHD Brother   . Schizophrenia Brother   . Bipolar disorder Brother   . Hypertension Brother   . Cirrhosis Brother   . Asthma Daughter   . Diabetes Maternal Grandmother   . Hypertension Maternal Grandmother   . Diabetes Paternal Grandmother   . Heart disease Paternal Grandmother   . Heart disease Brother     Social History Social History  Substance Use Topics  . Smoking status: Current Every Day Smoker  . Smokeless tobacco: None  . Alcohol Use: Yes     Comment: social drinking  5th of liquor weekly    Review of Systems  Constitutional: Negative for fever. Eyes: Negative for visual changes. ENT: Negative for sore throat. Cardiovascular: Negative for chest pain. Respiratory: Negative for shortness of breath. Gastrointestinal: Negative for abdominal pain, vomiting and diarrhea.Positive for left  flank pain Genitourinary: Negative for dysuria. Musculoskeletal: Negative for back pain. Skin: Negative for rash. Neurological: Negative for headaches, focal weakness or numbness.   10-point ROS otherwise negative.  ____________________________________________   PHYSICAL EXAM:  VITAL SIGNS: ED Triage Vitals  Enc Vitals Group     BP 02/21/16 0334 119/72 mmHg     Pulse Rate 02/21/16 0334 87     Resp 02/21/16 0334 18     Temp 02/21/16 0334 98.1 F (36.7 C)     Temp Source 02/21/16 0334 Oral     SpO2 02/21/16 0334 97 %     Weight 02/21/16 0334 124 lb (56.246 kg)     Height 02/21/16 0334 5\' 2"  (1.575 m)     Head Cir --      Peak Flow --      Pain Score 02/21/16 0335 9     Pain Loc --      Pain Edu? --      Excl. in GC? --      Constitutional: Alert and oriented. Well appearing and in no  distress. Eyes: Conjunctivae are normal. PERRL. Normal extraocular movements. ENT   Head: Normocephalic and atraumatic.   Nose: No congestion/rhinnorhea.   Mouth/Throat: Mucous membranes are moist.   Neck: No stridor. Hematological/Lymphatic/Immunilogical: No cervical lymphadenopathy. Cardiovascular: Normal rate, regular rhythm. Normal and symmetric distal pulses are present in all extremities. No murmurs, rubs, or gallops. Respiratory: Normal respiratory effort without tachypnea nor retractions. Breath sounds are clear and equal bilaterally. No wheezes/rales/rhonchi. Gastrointestinal: Soft and nontender. No distention. There is no CVA tenderness. Genitourinary: deferred Musculoskeletal: Nontender with normal range of motion in all extremities. No joint effusions.  No lower extremity tenderness nor edema. Neurologic:  Normal speech and language. No gross focal neurologic deficits are appreciated. Speech is normal.  Skin:  Skin is warm, dry and intact. No rash noted. Psychiatric: Mood and affect are normal. Speech and behavior are normal. Patient exhibits appropriate insight and judgment.  ____________________________________________    LABS (pertinent positives/negatives)  Labs Reviewed  URINALYSIS COMPLETEWITH MICROSCOPIC ([facility] ONLY) - Abnormal; Notable for the following:    Color, Urine YELLOW (*)    APPearance CLEAR (*)    Ketones, ur TRACE (*)    Specific Gravity, Urine 1.032 (*)    Squamous Epithelial / LPF 0-5 (*)    All other components within normal limits        RADIOLOGY    CT Renal Stone Study (Final result) Result time: 02/21/16 06:50:31   Final result by Rad Results In Interface (02/21/16 06:50:31)   Narrative:   CLINICAL DATA: 35 year old female with left flank pain.  EXAM: CT ABDOMEN AND PELVIS WITHOUT CONTRAST  TECHNIQUE: Multidetector CT imaging of the abdomen and pelvis was performed following the standard protocol without IV  contrast.  COMPARISON: None.  FINDINGS: Evaluation of this exam is limited in the absence of intravenous contrast.  The visualized lung bases are clear. No intra-abdominal free air or free fluid.  The liver, gallbladder, pancreas, spleen, adrenal glands, kidneys, visualized ureters, and urinary bladder appear unremarkable. A punctate calcific density in the left hemipelvis (series 2, image 60) likely represent pelvic phleboliths and less likely a distal left ureteral calculus. The uterus is anteverted and grossly unremarkable.  There is moderate stool throughout the colon. No evidence of bowel obstruction or active inflammation. Normal appendix.  The abdominal aorta and IVC appear grossly unremarkable on this noncontrast study. No portal venous gas identified. There is no adenopathy. The abdominal  wall soft tissues appear unremarkable. There is a small fat containing umbilical hernia. The osseous structures are intact.  IMPRESSION: Pelvic phlebolith versus less likely a punctate nonobstructing distal left ureteral calculus. No hydronephrosis.  No bowel obstruction. Normal appendix.   Electronically Signed By: Elgie CollardArash Radparvar M.D. On: 02/21/2016 06:50            INITIAL IMPRESSION / ASSESSMENT AND PLAN / ED COURSE  Pertinent labs & imaging results that were available during my care of the patient were reviewed by me and considered in my medical decision making (see chart for details).    ____________________________________________   FINAL CLINICAL IMPRESSION(S) / ED DIAGNOSES  Final diagnoses:  Kidney stone on left side      Darci Currentandolph N Brown, MD 02/21/16 2243

## 2016-02-21 NOTE — Discharge Instructions (Signed)
Kidney Stones °Kidney stones (urolithiasis) are deposits that form inside your kidneys. The intense pain is caused by the stone moving through the urinary tract. When the stone moves, the ureter goes into spasm around the stone. The stone is usually passed in the urine.  °CAUSES  °· A disorder that makes certain neck glands produce too much parathyroid hormone (primary hyperparathyroidism). °· A buildup of uric acid crystals, similar to gout in your joints. °· Narrowing (stricture) of the ureter. °· A kidney obstruction present at birth (congenital obstruction). °· Previous surgery on the kidney or ureters. °· Numerous kidney infections. °SYMPTOMS  °· Feeling sick to your stomach (nauseous). °· Throwing up (vomiting). °· Blood in the urine (hematuria). °· Pain that usually spreads (radiates) to the groin. °· Frequency or urgency of urination. °DIAGNOSIS  °· Taking a history and physical exam. °· Blood or urine tests. °· CT scan. °· Occasionally, an examination of the inside of the urinary bladder (cystoscopy) is performed. °TREATMENT  °· Observation. °· Increasing your fluid intake. °· Extracorporeal shock wave lithotripsy--This is a noninvasive procedure that uses shock waves to break up kidney stones. °· Surgery may be needed if you have severe pain or persistent obstruction. There are various surgical procedures. Most of the procedures are performed with the use of small instruments. Only small incisions are needed to accommodate these instruments, so recovery time is minimized. °The size, location, and chemical composition are all important variables that will determine the proper choice of action for you. Talk to your health care provider to better understand your situation so that you will minimize the risk of injury to yourself and your kidney.  °HOME CARE INSTRUCTIONS  °· Drink enough water and fluids to keep your urine clear or pale yellow. This will help you to pass the stone or stone fragments. °· Strain  all urine through the provided strainer. Keep all particulate matter and stones for your health care provider to see. The stone causing the pain may be as small as a grain of salt. It is very important to use the strainer each and every time you pass your urine. The collection of your stone will allow your health care provider to analyze it and verify that a stone has actually passed. The stone analysis will often identify what you can do to reduce the incidence of recurrences. °· Only take over-the-counter or prescription medicines for pain, discomfort, or fever as directed by your health care provider. °· Keep all follow-up visits as told by your health care provider. This is important. °· Get follow-up X-rays if required. The absence of pain does not always mean that the stone has passed. It may have only stopped moving. If the urine remains completely obstructed, it can cause loss of kidney function or even complete destruction of the kidney. It is your responsibility to make sure X-rays and follow-ups are completed. Ultrasounds of the kidney can show blockages and the status of the kidney. Ultrasounds are not associated with any radiation and can be performed easily in a matter of minutes. °· Make changes to your daily diet as told by your health care provider. You may be told to: °¨ Limit the amount of salt that you eat. °¨ Eat 5 or more servings of fruits and vegetables each day. °¨ Limit the amount of meat, poultry, fish, and eggs that you eat. °· Collect a 24-hour urine sample as told by your health care provider. You may need to collect another urine sample every 6-12   months. °SEEK MEDICAL CARE IF: °· You experience pain that is progressive and unresponsive to any pain medicine you have been prescribed. °SEEK IMMEDIATE MEDICAL CARE IF:  °· Pain cannot be controlled with the prescribed medicine. °· You have a fever or shaking chills. °· The severity or intensity of pain increases over 18 hours and is not  relieved by pain medicine. °· You develop a new onset of abdominal pain. °· You feel faint or pass out. °· You are unable to urinate. °  °This information is not intended to replace advice given to you by your health care provider. Make sure you discuss any questions you have with your health care provider. °  °Document Released: 08/19/2005 Document Revised: 05/10/2015 Document Reviewed: 01/20/2013 °Elsevier Interactive Patient Education ©2016 [employer] ° °

## 2016-10-11 ENCOUNTER — Ambulatory Visit (INDEPENDENT_AMBULATORY_CARE_PROVIDER_SITE_OTHER): Payer: Medicare Other | Admitting: Family Medicine

## 2016-10-11 ENCOUNTER — Encounter: Payer: Self-pay | Admitting: Family Medicine

## 2016-10-11 VITALS — BP 130/80 | Temp 98.2°F | Wt 120.0 lb

## 2016-10-11 DIAGNOSIS — N760 Acute vaginitis: Secondary | ICD-10-CM

## 2016-10-11 DIAGNOSIS — R1032 Left lower quadrant pain: Secondary | ICD-10-CM

## 2016-10-11 DIAGNOSIS — R109 Unspecified abdominal pain: Secondary | ICD-10-CM | POA: Diagnosis not present

## 2016-10-11 DIAGNOSIS — N83299 Other ovarian cyst, unspecified side: Secondary | ICD-10-CM | POA: Diagnosis not present

## 2016-10-11 DIAGNOSIS — N898 Other specified noninflammatory disorders of vagina: Secondary | ICD-10-CM

## 2016-10-11 DIAGNOSIS — Z113 Encounter for screening for infections with a predominantly sexual mode of transmission: Secondary | ICD-10-CM | POA: Diagnosis not present

## 2016-10-11 DIAGNOSIS — R300 Dysuria: Secondary | ICD-10-CM

## 2016-10-11 DIAGNOSIS — B9689 Other specified bacterial agents as the cause of diseases classified elsewhere: Secondary | ICD-10-CM

## 2016-10-11 LAB — MICROSCOPIC EXAMINATION: RBC MICROSCOPIC, UA: NONE SEEN /HPF (ref 0–?)

## 2016-10-11 LAB — UA/M W/RFLX CULTURE, ROUTINE
Bilirubin, UA: NEGATIVE
Glucose, UA: NEGATIVE
Ketones, UA: NEGATIVE
Nitrite, UA: NEGATIVE
PH UA: 7.5 (ref 5.0–7.5)
Protein, UA: NEGATIVE
RBC, UA: NEGATIVE
Specific Gravity, UA: 1.02 (ref 1.005–1.030)
Urobilinogen, Ur: 1 mg/dL (ref 0.2–1.0)

## 2016-10-11 LAB — WET PREP FOR TRICH, YEAST, CLUE
Clue Cell Exam: POSITIVE — AB
TRICHOMONAS EXAM: NEGATIVE
Yeast Exam: NEGATIVE

## 2016-10-11 MED ORDER — METRONIDAZOLE 500 MG PO TABS: 500.0000 mg | ORAL_TABLET | Freq: Two times a day (BID) | ORAL | 0 refills | Status: DC

## 2016-10-11 NOTE — Assessment & Plan Note (Signed)
Will get follow up US. Scheduled for Wednesday. Await results.

## 2016-10-11 NOTE — Progress Notes (Signed)
BP 130/80   Temp 98.2 F (36.8 C)   Wt 120 lb (54.4 kg)   LMP 10/06/2016   SpO2 99%   BMI 21.95 kg/m    Subjective:    Patient ID: Jeanette Wilson, female    DOB: [birth year], 36 y.o.   MRN: [id number]  HPI: Jeanette AxonSonyele L Jamar is a 36 y.o. female  Chief Complaint  Patient presents with  . Abdominal Pain    sharp pain, off and on for a couple years, gets worse right after her cycle  . Vaginal Discharge    x 1 week, fishy smelling discharge   ABDOMINAL PAIN- has known ovarian cysts Duration: chronic about 12 years- worse for about a month.  Onset: sudden Severity: 6/10 Quality: sharp, like contractions Location:  LLQ  Episode duration: less than 5 minutes Radiation: no Frequency: intermittent Alleviating factors: sleep Aggravating factors: sex Status: worse Treatments attempted: none Fever: no Nausea: yes Vomiting: no Weight loss: no Decreased appetite: no Diarrhea: yes Constipation: no Blood in stool: no Heartburn: no Jaundice: no Rash: no Dysuria/urinary frequency: no Hematuria: no History of sexually transmitted disease: no- concerns, would like to be checked Recurrent NSAID use: no  VAGINAL DISCHARGE Duration: about a week Discharge description: mucous  Pruritus: no Dysuria: no Malodorous: worse after sex Urinary frequency: yes Fevers: no Abdominal pain: yes  Sexual activity: concerned about STIs, same partner History of sexually transmitted diseases: yes Recent antibiotic use: no Context: worse  Treatments attempted: none  Relevant past medical, surgical, family and social history reviewed and updated as indicated. Interim medical history since our last visit reviewed. Allergies and medications reviewed and updated.  Review of Systems  Constitutional: Negative.   Respiratory: Negative.   Cardiovascular: Negative.   Gastrointestinal: Positive for abdominal pain and diarrhea. Negative for abdominal distention, anal bleeding, blood in stool,  constipation, nausea, rectal pain and vomiting.  Genitourinary: Positive for dyspareunia, dysuria, frequency, pelvic pain, urgency, vaginal discharge and vaginal pain. Negative for decreased urine volume, difficulty urinating, enuresis, flank pain, genital sores, hematuria, menstrual problem and vaginal bleeding.  Psychiatric/Behavioral: Negative.     Per HPI unless specifically indicated above     Objective:    BP 130/80   Temp 98.2 F (36.8 C)   Wt 120 lb (54.4 kg)   LMP 10/06/2016   SpO2 99%   BMI 21.95 kg/m   Wt Readings from Last 3 Encounters:  10/11/16 120 lb (54.4 kg)  02/21/16 124 lb (56.2 kg)  12/26/15 128 lb (58.1 kg)    Physical Exam  Constitutional: She is oriented to person, place, and time. She appears well-developed and well-nourished. No distress.  HENT:  Head: Normocephalic and atraumatic.  Right Ear: Hearing normal.  Left Ear: Hearing normal.  Nose: Nose normal.  Eyes: Conjunctivae and lids are normal. Right eye exhibits no discharge. Left eye exhibits no discharge. No scleral icterus.  Pulmonary/Chest: Effort normal. No respiratory distress.  Abdominal: Soft. Bowel sounds are normal. She exhibits no distension and no mass. There is tenderness in the left lower quadrant. There is no rebound and no guarding. Hernia confirmed negative in the right inguinal area and confirmed negative in the left inguinal area.  Genitourinary: Uterus normal. No labial fusion. There is no rash, tenderness, lesion or injury on the right labia. There is no rash, tenderness, lesion or injury on the left labia. No erythema, tenderness or bleeding in the vagina. No foreign body in the vagina. No signs of injury around the vagina.  Vaginal discharge found.  Musculoskeletal: Normal range of motion.  Neurological: She is alert and oriented to person, place, and time.  Skin: Skin is warm, dry and intact. No rash noted. No erythema. No pallor.  Psychiatric: She has a normal mood and affect.  Her speech is normal and behavior is normal. Judgment and thought content normal. Cognition and memory are normal.  Nursing note and vitals reviewed.   Results for orders placed or performed during the hospital encounter of 02/21/16  Urinalysis complete, with microscopic- may I&O cath if menses  Result Value Ref Range   Color, Urine YELLOW (A) YELLOW   APPearance CLEAR (A) CLEAR   Glucose, UA NEGATIVE NEGATIVE mg/dL   Bilirubin Urine NEGATIVE NEGATIVE   Ketones, ur TRACE (A) NEGATIVE mg/dL   Specific Gravity, Urine 1.032 (H) 1.005 - 1.030   Hgb urine dipstick NEGATIVE NEGATIVE   pH 5.0 5.0 - 8.0   Protein, ur NEGATIVE NEGATIVE mg/dL   Nitrite NEGATIVE NEGATIVE   Leukocytes, UA NEGATIVE NEGATIVE   RBC / HPF NONE SEEN 0 - 5 RBC/hpf   WBC, UA 0-5 0 - 5 WBC/hpf   Bacteria, UA NONE SEEN NONE SEEN   Squamous Epithelial / LPF 0-5 (A) NONE SEEN   Mucous PRESENT       Assessment & Plan:   Problem List Items Addressed This Visit      Genitourinary   Complex ovarian cyst    Will get follow up US. Scheduled for Wednesday. Await results.       Relevant Orders   US Pelvis Complete   US Transvaginal Non-OB     Other   Left lower quadrant pain - Primary   Relevant Orders   US Pelvis Complete   US Transvaginal Non-OB    Other Visit Diagnoses    Routine screening for STI (sexually transmitted infection)       Labs drawn today, await results.    Relevant Orders   HIV antibody   RPR   Hepatitis, Acute   GC/Chlamydia Probe Amp   HSV(herpes simplex vrs) 1+2 ab-IgG   Vaginal discharge       Wet prep done today. +BV   Relevant Orders   WET PREP FOR TRICH, YEAST, CLUE   Dysuria       Trace leuks. Will await culture.    Relevant Orders   UA/M w/rflx Culture, Routine   BV (bacterial vaginosis)       Will treat with metronidazole. Call with any concerns.    Relevant Medications   metroNIDAZOLE (FLAGYL) 500 MG tablet       Follow up plan: Return if symptoms worsen or fail to  improve, for Pending results.

## 2016-10-11 NOTE — Patient Instructions (Signed)
Arrive Outpatient imaging center with FULL BLADDER  10/16/16 10:45AM [facility] [address] [place] [id number]  Get Driving Directions  Main: [phone]

## 2016-10-12 LAB — HEPATITIS PANEL, ACUTE
HEP B C IGM: NEGATIVE
HEP B S AG: NEGATIVE
Hep A IgM: NEGATIVE
Hep C Virus Ab: <0.1 s/co ratio (ref 0.0–0.9)

## 2016-10-12 LAB — RPR: RPR Ser Ql: NONREACTIVE

## 2016-10-12 LAB — HSV(HERPES SIMPLEX VRS) I + II AB-IGG: HSV 1 GLYCOPROTEIN G AB, IGG: 30 {index} — AB (ref 0.00–0.90)

## 2016-10-12 LAB — HIV ANTIBODY (ROUTINE TESTING W REFLEX): HIV Screen 4th Generation wRfx: NONREACTIVE

## 2016-10-13 LAB — GC/CHLAMYDIA PROBE AMP
Chlamydia trachomatis, NAA: NEGATIVE
Neisseria gonorrhoeae by PCR: NEGATIVE

## 2016-10-14 ENCOUNTER — Telehealth: Payer: Self-pay | Admitting: Family Medicine

## 2016-10-14 NOTE — Telephone Encounter (Signed)
Please let her know that all her labs came back normal except she's been exposed to the cold sore virus like we talked about last time. Thanks!

## 2016-10-14 NOTE — Telephone Encounter (Signed)
Informed patient that all her labs came back normal per Dr. Laural BenesJohnson.

## 2016-10-16 ENCOUNTER — Ambulatory Visit
Admission: RE | Admit: 2016-10-16 | Discharge: 2016-10-16 | Disposition: A | Discharge status: Home / Self Care | Payer: Medicare Other | Source: Ambulatory Visit | Attending: Family Medicine | Admitting: Family Medicine

## 2016-10-16 DIAGNOSIS — R1032 Left lower quadrant pain: Secondary | ICD-10-CM | POA: Insufficient documentation

## 2016-10-16 DIAGNOSIS — N83299 Other ovarian cyst, unspecified side: Secondary | ICD-10-CM | POA: Diagnosis present

## 2016-10-17 ENCOUNTER — Telehealth: Payer: Self-pay | Admitting: Family Medicine

## 2016-10-17 NOTE — Telephone Encounter (Signed)
Called pt to give her results. US was normal. Will finish meds for BV, if not feeling better, will return to discuss further options.

## 2017-04-30 ENCOUNTER — Encounter: Payer: Self-pay | Admitting: Family Medicine

## 2017-04-30 ENCOUNTER — Ambulatory Visit (INDEPENDENT_AMBULATORY_CARE_PROVIDER_SITE_OTHER): Payer: Medicare Other | Admitting: Family Medicine

## 2017-04-30 VITALS — BP 128/88 | HR 91 | Temp 98.3°F | Wt 116.2 lb

## 2017-04-30 DIAGNOSIS — G8929 Other chronic pain: Secondary | ICD-10-CM | POA: Diagnosis not present

## 2017-04-30 DIAGNOSIS — R5383 Other fatigue: Secondary | ICD-10-CM | POA: Diagnosis not present

## 2017-04-30 DIAGNOSIS — M79605 Pain in left leg: Secondary | ICD-10-CM

## 2017-04-30 DIAGNOSIS — Z13220 Encounter for screening for lipoid disorders: Secondary | ICD-10-CM | POA: Diagnosis not present

## 2017-04-30 DIAGNOSIS — R238 Other skin changes: Secondary | ICD-10-CM | POA: Diagnosis not present

## 2017-04-30 DIAGNOSIS — M25562 Pain in left knee: Secondary | ICD-10-CM

## 2017-04-30 DIAGNOSIS — R233 Spontaneous ecchymoses: Secondary | ICD-10-CM

## 2017-04-30 NOTE — Progress Notes (Signed)
BP 128/88 (BP Location: Left Arm, Patient Position: Sitting, Cuff Size: Normal)   Pulse 91   Temp 98.3 F (36.8 C)   Wt 116 lb 4 oz (52.7 kg)   SpO2 100%   BMI 21.26 kg/m    Subjective:    Patient ID: Jeanette Wilson, female    DOB: 1981-08-05, 36 y.o.   MRN: [id number]  HPI: Jeanette AxonSonyele L Coby is a 36 y.o. female  Chief Complaint  Patient presents with  . Leg Pain    Left, patient states that it hurts and feels heavy at times, it does go numb also. Patient states that she will get red spots and bruises without any injury   LEG PAIN- feels really heavy and sore in the lower portion of her leg and up into her thigh  Duration: 5 months Involved leg: left Mechanism of injury: unknown Location: heavy in her ankle, hurts in her knee Onset: sudden Severity: tolerable pain most of the time, then with standing sharp severe pain Quality:  Aching most of the time, sharp pain in her knee Frequency: constant aching and heaviness, sharp pain occasionally Radiation: yes into her calf Aggravating factors: standing and prolonged sitting  Alleviating factors: walking and moving   Status: stable Treatments attempted: orange juice   Relief with NSAIDs?:  No NSAIDs Taken Weakness with weight bearing or walking: yes Sensation of giving way: yes Locking: no Popping: no Bruising: yes Swelling: no Redness: yes Paresthesias/decreased sensation: yes Fevers: no  Relevant past medical, surgical, family and social history reviewed and updated as indicated. Interim medical history since our last visit reviewed. Allergies and medications reviewed and updated.  Review of Systems  Constitutional: Negative.   Respiratory: Negative.   Cardiovascular: Negative.   Musculoskeletal: Positive for arthralgias and joint swelling. Negative for back pain, gait problem, myalgias, neck pain and neck stiffness.  Skin: Negative.   Hematological: Bruises/bleeds easily.  Psychiatric/Behavioral: Negative.      Per HPI unless specifically indicated above     Objective:    BP 128/88 (BP Location: Left Arm, Patient Position: Sitting, Cuff Size: Normal)   Pulse 91   Temp 98.3 F (36.8 C)   Wt 116 lb 4 oz (52.7 kg)   SpO2 100%   BMI 21.26 kg/m   Wt Readings from Last 3 Encounters:  04/30/17 116 lb 4 oz (52.7 kg)  10/11/16 120 lb (54.4 kg)  02/21/16 124 lb (56.2 kg)    Physical Exam  Constitutional: She is oriented to person, place, and time. She appears well-developed and well-nourished. No distress.  HENT:  Head: Normocephalic and atraumatic.  Right Ear: Hearing normal.  Left Ear: Hearing normal.  Nose: Nose normal.  Eyes: Conjunctivae and lids are normal. Right eye exhibits no discharge. Left eye exhibits no discharge. No scleral icterus.  Cardiovascular: Normal rate, regular rhythm, normal heart sounds and intact distal pulses.  Exam reveals no gallop and no friction rub.   No murmur heard. Pulmonary/Chest: Effort normal and breath sounds normal. No respiratory distress. She has no wheezes. She has no rales. She exhibits no tenderness.  Musculoskeletal: Normal range of motion. She exhibits tenderness. She exhibits no edema or deformity.  Tenderness along joint line of L knee, negative Mcmurrays, negative appley's compression and distraction, negative anterior and posterior drawer, prominent veins over area of pain in the ankle, no redness, no swelling  Neurological: She is alert and oriented to person, place, and time.  Skin: Skin is intact. No rash noted. She  is not diaphoretic.  Psychiatric: She has a normal mood and affect. Her speech is normal and behavior is normal. Judgment and thought content normal. Cognition and memory are normal.  Nursing note and vitals reviewed.   Results for orders placed or performed in visit on 10/11/16  WET PREP FOR TRICH, YEAST, CLUE  Result Value Ref Range   Trichomonas Exam Negative Negative   Yeast Exam Negative Negative   Clue Cell Exam  Positive (A) Negative  GC/Chlamydia Probe Amp  Result Value Ref Range   Chlamydia trachomatis, NAA Negative Negative   Neisseria gonorrhoeae by PCR Negative Negative  Microscopic Examination  Result Value Ref Range   WBC, UA 0-5 0 - 5 /hpf   RBC, UA None seen 0 - 2 /hpf   Epithelial Cells (non renal) 0-10 0 - 10 /hpf   Bacteria, UA Few (A) None seen/Few  HIV antibody  Result Value Ref Range   HIV Screen 4th Generation wRfx Non Reactive Non Reactive  RPR  Result Value Ref Range   RPR Ser Ql Non Reactive Non Reactive  Hepatitis, Acute  Result Value Ref Range   Hep A IgM Negative Negative   Hepatitis B Surface Ag Negative Negative   Hep B C IgM Negative Negative   Hep C Virus Ab <0.1 0.0 - 0.9 s/co ratio  UA/M w/rflx Culture, Routine  Result Value Ref Range   Specific Gravity, UA 1.020 1.005 - 1.030   pH, UA 7.5 5.0 - 7.5   Color, UA Yellow Yellow   Appearance Ur Clear Clear   Leukocytes, UA Trace (A) Negative   Protein, UA Negative Negative/Trace   Glucose, UA Negative Negative   Ketones, UA Negative Negative   RBC, UA Negative Negative   Bilirubin, UA Negative Negative   Urobilinogen, Ur 1.0 0.2 - 1.0 mg/dL   Nitrite, UA Negative Negative   Microscopic Examination See below:   HSV(herpes simplex vrs) 1+2 ab-IgG  Result Value Ref Range   HSV 1 Glycoprotein G Ab, IgG 30.00 (H) 0.00 - 0.90 index   HSV 2 Glycoprotein G Ab, IgG <0.91 0.00 - 0.90 index      Assessment & Plan:   Problem List Items Addressed This Visit    None    Visit Diagnoses    Chronic pain of left knee    -  Primary   WIll look for tick diseases and check x-ray. Await results. Call with any concerns.    Relevant Orders   DG Knee Complete 4 Views Left   Lyme Ab/Western Blot Reflex   Rocky mtn spotted fvr abs pnl(IgG+IgM)   Ehrlichia Antibody Panel   Babesia microti Antibody Panel   Screening for cholesterol level       Checking labs today. Await results.    Relevant Orders   Lipid Panel w/o  Chol/HDL Ratio   Pain of left lower extremity       Likely leg fatigue. Will start compression stockings. Call with any concerns.    Relevant Orders   TSH   Comprehensive metabolic panel   VITAMIN D 25 Hydroxy (Vit-D Deficiency, Fractures)   Easy bruising       Checking labs today. Await results.    Relevant Orders   CBC with Differential/Platelet       Follow up plan: Return 2-4 weeks, for follow up knee.

## 2017-05-02 ENCOUNTER — Telehealth: Payer: Self-pay | Admitting: Family Medicine

## 2017-05-02 LAB — CBC WITH DIFFERENTIAL/PLATELET
BASOS ABS: 0 10*3/uL (ref 0.0–0.2)
Basos: 0 %
EOS (ABSOLUTE): 0 10*3/uL (ref 0.0–0.4)
Eos: 0 %
HEMATOCRIT: 40.5 % (ref 34.0–46.6)
Hemoglobin: 13.1 g/dL (ref 11.1–15.9)
IMMATURE GRANULOCYTES: 0 %
Immature Grans (Abs): 0 10*3/uL (ref 0.0–0.1)
LYMPHS ABS: 1.9 10*3/uL (ref 0.7–3.1)
Lymphs: 33 %
MCH: 31.3 pg (ref 26.6–33.0)
MCHC: 32.3 g/dL (ref 31.5–35.7)
MCV: 97 fL (ref 79–97)
MONOS ABS: 0.5 10*3/uL (ref 0.1–0.9)
Monocytes: 8 %
NEUTROS PCT: 59 %
Neutrophils Absolute: 3.3 10*3/uL (ref 1.4–7.0)
PLATELETS: 299 10*3/uL (ref 150–379)
RBC: 4.19 x10E6/uL (ref 3.77–5.28)
RDW: 13.1 % (ref 12.3–15.4)
WBC: 5.6 10*3/uL (ref 3.4–10.8)

## 2017-05-02 LAB — LIPID PANEL W/O CHOL/HDL RATIO
CHOLESTEROL TOTAL: 185 mg/dL (ref 100–199)
HDL: 87 mg/dL (ref >39)
LDL Calculated: 87 mg/dL (ref 0–99)
TRIGLYCERIDES: 54 mg/dL (ref 0–149)
VLDL Cholesterol Cal: 11 mg/dL (ref 5–40)

## 2017-05-02 LAB — ROCKY MTN SPOTTED FVR ABS PNL(IGG+IGM)
RMSF IGM: 0.76 {index} (ref 0.00–0.89)
RMSF IgG: NEGATIVE

## 2017-05-02 LAB — COMPREHENSIVE METABOLIC PANEL
ALBUMIN: 4.8 g/dL (ref 3.5–5.5)
ALK PHOS: 35 IU/L — AB (ref 39–117)
ALT: 8 IU/L (ref 0–32)
AST: 13 IU/L (ref 0–40)
Albumin/Globulin Ratio: 1.6 (ref 1.2–2.2)
BILIRUBIN TOTAL: 0.8 mg/dL (ref 0.0–1.2)
BUN / CREAT RATIO: 16 (ref 9–23)
BUN: 15 mg/dL (ref 6–20)
CHLORIDE: 102 mmol/L (ref 96–106)
CO2: 20 mmol/L (ref 20–29)
CREATININE: 0.93 mg/dL (ref 0.57–1.00)
Calcium: 9.6 mg/dL (ref 8.7–10.2)
GFR calc Af Amer: 93 mL/min/{1.73_m2} (ref >59)
GFR calc non Af Amer: 80 mL/min/{1.73_m2} (ref >59)
GLOBULIN, TOTAL: 3 g/dL (ref 1.5–4.5)
Glucose: 85 mg/dL (ref 65–99)
POTASSIUM: 4.3 mmol/L (ref 3.5–5.2)
SODIUM: 138 mmol/L (ref 134–144)
Total Protein: 7.8 g/dL (ref 6.0–8.5)

## 2017-05-02 LAB — BABESIA MICROTI ANTIBODY PANEL: Babesia microti IgG: <1:10 {titer}

## 2017-05-02 LAB — VITAMIN D 25 HYDROXY (VIT D DEFICIENCY, FRACTURES): Vit D, 25-Hydroxy: 17.7 ng/mL — ABNORMAL LOW (ref 30.0–100.0)

## 2017-05-02 LAB — LYME AB/WESTERN BLOT REFLEX
LYME DISEASE AB, QUANT, IGM: <0.8 index (ref 0.00–0.79)
Lyme IgG/IgM Ab: <0.91 {ISR} (ref 0.00–0.90)

## 2017-05-02 LAB — EHRLICHIA ANTIBODY PANEL
E. CHAFFEENSIS (HME) IGM TITER: NEGATIVE
E. CHAFFEENSIS IGG AB: NEGATIVE
HGE IGM TITER: NEGATIVE
HGE IgG Titer: NEGATIVE

## 2017-05-02 LAB — TSH: TSH: 0.716 u[IU]/mL (ref 0.450–4.500)

## 2017-05-02 MED ORDER — VITAMIN D (ERGOCALCIFEROL) 1.25 MG (50000 UNIT) PO CAPS: 50000.0000 [IU] | ORAL_CAPSULE | ORAL | 0 refills | Status: DC

## 2017-05-02 NOTE — Telephone Encounter (Signed)
Called patient, no answer, left message for patient to return my call.  

## 2017-05-02 NOTE — Telephone Encounter (Signed)
Please let her know that all her labs came back normal except her vitamin D, which was low, so I sent an Rx to her pharmacy to bring it back up. Thanks!

## 2017-05-06 NOTE — Telephone Encounter (Signed)
Patient notified

## 2017-05-11 ENCOUNTER — Encounter (HOSPITAL_COMMUNITY): Payer: Self-pay

## 2017-05-11 ENCOUNTER — Emergency Department (HOSPITAL_COMMUNITY): Payer: No Typology Code available for payment source

## 2017-05-11 ENCOUNTER — Emergency Department (HOSPITAL_COMMUNITY)
Admission: EM | Admit: 2017-05-11 | Discharge: 2017-05-11 | Disposition: A | Discharge status: Home / Self Care | Payer: No Typology Code available for payment source | Attending: Emergency Medicine | Admitting: Emergency Medicine

## 2017-05-11 DIAGNOSIS — Y92410 Unspecified street and highway as the place of occurrence of the external cause: Secondary | ICD-10-CM | POA: Insufficient documentation

## 2017-05-11 DIAGNOSIS — R51 Headache: Secondary | ICD-10-CM | POA: Diagnosis not present

## 2017-05-11 DIAGNOSIS — S0993XA Unspecified injury of face, initial encounter: Secondary | ICD-10-CM | POA: Diagnosis not present

## 2017-05-11 DIAGNOSIS — Y9389 Activity, other specified: Secondary | ICD-10-CM | POA: Diagnosis not present

## 2017-05-11 DIAGNOSIS — M25531 Pain in right wrist: Secondary | ICD-10-CM | POA: Diagnosis not present

## 2017-05-11 DIAGNOSIS — S60811A Abrasion of right wrist, initial encounter: Secondary | ICD-10-CM | POA: Insufficient documentation

## 2017-05-11 DIAGNOSIS — Y998 Other external cause status: Secondary | ICD-10-CM | POA: Insufficient documentation

## 2017-05-11 DIAGNOSIS — H53149 Visual discomfort, unspecified: Secondary | ICD-10-CM | POA: Diagnosis not present

## 2017-05-11 DIAGNOSIS — F17210 Nicotine dependence, cigarettes, uncomplicated: Secondary | ICD-10-CM | POA: Diagnosis not present

## 2017-05-11 DIAGNOSIS — M79652 Pain in left thigh: Secondary | ICD-10-CM | POA: Insufficient documentation

## 2017-05-11 DIAGNOSIS — S0990XA Unspecified injury of head, initial encounter: Secondary | ICD-10-CM | POA: Diagnosis present

## 2017-05-11 DIAGNOSIS — S0081XA Abrasion of other part of head, initial encounter: Secondary | ICD-10-CM | POA: Diagnosis not present

## 2017-05-11 DIAGNOSIS — R519 Headache, unspecified: Secondary | ICD-10-CM

## 2017-05-11 MED ORDER — IBUPROFEN 400 MG PO TABS
ORAL_TABLET | ORAL | Status: AC
  Filled 2017-05-11: qty 1

## 2017-05-11 MED ORDER — IBUPROFEN 400 MG PO TABS
400.0000 mg | ORAL_TABLET | Freq: Once | ORAL | Status: AC | PRN
  Administered 2017-05-11: 400 mg via ORAL

## 2017-05-11 MED ORDER — CYCLOBENZAPRINE HCL 10 MG PO TABS: 10.0000 mg | ORAL_TABLET | Freq: Three times a day (TID) | ORAL | 0 refills | Status: DC | PRN
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
## 2017-05-11 NOTE — ED Notes (Signed)
Declined W/C at D/C and was escorted to lobby by RN. 

## 2017-05-11 NOTE — Discharge Instructions (Signed)
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
## 2017-05-15 ENCOUNTER — Ambulatory Visit: Payer: Medicare Other | Admitting: Family Medicine

## 2017-05-19 ENCOUNTER — Encounter (HOSPITAL_COMMUNITY): Payer: Self-pay | Admitting: *Deleted

## 2017-05-19 ENCOUNTER — Emergency Department (HOSPITAL_COMMUNITY)
Admission: EM | Admit: 2017-05-19 | Discharge: 2017-05-19 | Disposition: A | Discharge status: Home / Self Care | Payer: No Typology Code available for payment source | Attending: Emergency Medicine | Admitting: Emergency Medicine

## 2017-05-19 ENCOUNTER — Emergency Department (HOSPITAL_COMMUNITY): Payer: No Typology Code available for payment source

## 2017-05-19 DIAGNOSIS — Y9389 Activity, other specified: Secondary | ICD-10-CM | POA: Diagnosis not present

## 2017-05-19 DIAGNOSIS — F17210 Nicotine dependence, cigarettes, uncomplicated: Secondary | ICD-10-CM | POA: Insufficient documentation

## 2017-05-19 DIAGNOSIS — Y92410 Unspecified street and highway as the place of occurrence of the external cause: Secondary | ICD-10-CM | POA: Insufficient documentation

## 2017-05-19 DIAGNOSIS — M62838 Other muscle spasm: Secondary | ICD-10-CM | POA: Insufficient documentation

## 2017-05-19 DIAGNOSIS — M62830 Muscle spasm of back: Secondary | ICD-10-CM | POA: Diagnosis not present

## 2017-05-19 DIAGNOSIS — Y999 Unspecified external cause status: Secondary | ICD-10-CM | POA: Insufficient documentation

## 2017-05-19 DIAGNOSIS — M25512 Pain in left shoulder: Secondary | ICD-10-CM | POA: Diagnosis not present

## 2017-05-19 DIAGNOSIS — S4992XA Unspecified injury of left shoulder and upper arm, initial encounter: Secondary | ICD-10-CM | POA: Diagnosis not present

## 2017-05-19 MED ORDER — METHOCARBAMOL 500 MG PO TABS: 500.0000 mg | ORAL_TABLET | Freq: Three times a day (TID) | ORAL | 0 refills | Status: AC | PRN
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
## 2017-05-19 NOTE — Discharge Instructions (Signed)
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
## 2017-05-20 ENCOUNTER — Other Ambulatory Visit: Payer: Self-pay | Admitting: *Deleted

## 2017-05-20 ENCOUNTER — Other Ambulatory Visit: Payer: Self-pay
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
## 2017-05-21 ENCOUNTER — Encounter: Payer: Self-pay | Admitting: *Deleted

## 2017-05-27 ENCOUNTER — Encounter: Payer: Self-pay | Admitting: *Deleted

## 2017-05-27 ENCOUNTER — Other Ambulatory Visit: Payer: Self-pay | Admitting: *Deleted

## 2017-05-27 NOTE — Patient Outreach (Signed)
[facility]) Care Management  05/27/2017  Jeanette Wilson 1981/05/30 [id number]   CSW was able to make initial contact with patient today to perform phone assessment, as well as assess and assist with social work needs and services.  CSW introduced self, explained role and types of services provided through [employer] Care Management [facility] Care Management).  CSW further explained to patient that CSW works with patient's Telephonic RNCM, also with [facility] Care Management, [name]. CSW then explained the reason for the call, indicating that Jeanette Wilson thought that patient would benefit from social work services and resources to assist with arranging counseling and supportive services for patient, currently suffering from symptoms of depression.  CSW obtained two HIPAA compliant identifiers from patient, which included patient's name and date of birth. Patient admits to suffering from symptoms of depression, requesting resource information.  W provided counseling and supportive services, where appropriate.  CSW agreed to mail patient the following list of EMMI information: Coping With A Health Condition: Signs Of Depression Depression: Other Things You Can Do Depression Medication Depression CSW also agreed to mail patient a list of counselors/therapists in [place] that accept [facility] and/or Adult Medicaid.  Last, CSW agreed to mail patient a [facility], academic and Consent for Treatment form.  CSW will follow-up with patient next week to ensure that she received the packet of resources, as well as answer any questions she may have at that time. Danford Bad, BSW, MSW, LCSW  Licensed Restaurant manager, fast food [facility]  [address]ailing [place] [address], [place], [place] [address] Physical Address-[address], [place], [place] [address] Toll Free Main #  [phone] Fax # [phone] Cell # [phone]  Office # [phone] [email or web address]

## 2017-05-29 ENCOUNTER — Ambulatory Visit (INDEPENDENT_AMBULATORY_CARE_PROVIDER_SITE_OTHER): Payer: Medicare Other | Admitting: Family Medicine

## 2017-05-29 ENCOUNTER — Encounter: Payer: Self-pay | Admitting: Family Medicine

## 2017-05-29 VITALS — BP 105/65 | HR 78 | Temp 99.0°F | Wt 117.1 lb

## 2017-05-29 DIAGNOSIS — M25512 Pain in left shoulder: Secondary | ICD-10-CM

## 2017-05-29 DIAGNOSIS — M25562 Pain in left knee: Secondary | ICD-10-CM | POA: Diagnosis not present

## 2017-05-29 DIAGNOSIS — G8929 Other chronic pain: Secondary | ICD-10-CM

## 2017-05-29 MED ORDER — NAPROXEN 500 MG PO TABS: 500.0000 mg | ORAL_TABLET | Freq: Two times a day (BID) | ORAL | 1 refills | Status: DC
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
## 2017-05-29 NOTE — Patient Instructions (Signed)
[facility] [address], [place], [place] [address]  Phone: [phone]  [address] [place], [place], [place] [address]  Phone: [phone]

## 2017-06-03 ENCOUNTER — Encounter: Payer: Self-pay | Admitting: *Deleted

## 2017-06-03 ENCOUNTER — Other Ambulatory Visit: Payer: Self-pay | Admitting: *Deleted

## 2017-06-03 NOTE — Patient Outreach (Signed)
[facility]) Care Management  06/03/2017  Jeanette Wilson 1981-03-07 [id number]   CSW was able to make contact with patient today to follow-up regarding social work services and resources, as well as ensure that patient received the packet of resources mailed to her home by CSW.  Patient admitted to receiving the packet of resource information, but denied having any questions or needing assistance with the referral process. Patient denied having any additional social work needs at present.  CSW was able to ensure that patient has the correct contact information for CSW, encouraging patient to contact CSW directly if additional social work needs arise in the near future.  Patient voiced understanding and was agreeable to this plan, not wanting to seek counseling and/or supportive services at this time. CSW will perform a case closure on patient, as all goals of treatment have been met from social work standpoint and no additional social work needs have been identified at this time.  CSW will fax an update to patient's Primary Care Physician, Dr. Park Liter to ensure that they are aware of CSW's involvement with patient's plan of care.  CSW will submit a case closure request to [name], Care Management Assistant with [facility], in the form of an [employer].   [name], BSW, MSW, LCSW  Licensed Education officer, environmental [facility]  Mailing [place] [address] [place], [place] [address] Physical Address-[address], [place], [place] [address] Toll Free Main # [phone] Fax # [phone] Cell # [phone]  Office # [phone] [name]

## 2017-06-05 DIAGNOSIS — M25612 Stiffness of left shoulder, not elsewhere classified: Secondary | ICD-10-CM | POA: Diagnosis not present

## 2017-06-05 DIAGNOSIS — M6281 Muscle weakness (generalized): Secondary | ICD-10-CM | POA: Diagnosis not present

## 2017-06-05 DIAGNOSIS — M25512 Pain in left shoulder: Secondary | ICD-10-CM | POA: Diagnosis not present

## 2017-06-10 DIAGNOSIS — M25512 Pain in left shoulder: Secondary | ICD-10-CM | POA: Diagnosis not present

## 2017-06-10 DIAGNOSIS — M25612 Stiffness of left shoulder, not elsewhere classified: Secondary | ICD-10-CM | POA: Diagnosis not present

## 2017-06-10 DIAGNOSIS — M6281 Muscle weakness (generalized): Secondary | ICD-10-CM | POA: Diagnosis not present

## 2017-06-12 DIAGNOSIS — M6281 Muscle weakness (generalized): Secondary | ICD-10-CM | POA: Diagnosis not present

## 2017-06-12 DIAGNOSIS — M25512 Pain in left shoulder: Secondary | ICD-10-CM | POA: Diagnosis not present

## 2017-06-12 DIAGNOSIS — M25612 Stiffness of left shoulder, not elsewhere classified: Secondary | ICD-10-CM | POA: Diagnosis not present

## 2017-06-16 DIAGNOSIS — M25612 Stiffness of left shoulder, not elsewhere classified: Secondary | ICD-10-CM | POA: Diagnosis not present

## 2017-06-16 DIAGNOSIS — M25512 Pain in left shoulder: Secondary | ICD-10-CM | POA: Diagnosis not present

## 2017-06-16 DIAGNOSIS — M6281 Muscle weakness (generalized): Secondary | ICD-10-CM | POA: Diagnosis not present

## 2017-06-18 DIAGNOSIS — M25612 Stiffness of left shoulder, not elsewhere classified: Secondary | ICD-10-CM | POA: Diagnosis not present

## 2017-06-18 DIAGNOSIS — M6281 Muscle weakness (generalized): Secondary | ICD-10-CM | POA: Diagnosis not present

## 2017-06-18 DIAGNOSIS — M25512 Pain in left shoulder: Secondary | ICD-10-CM | POA: Diagnosis not present

## 2017-06-25 DIAGNOSIS — M6281 Muscle weakness (generalized): Secondary | ICD-10-CM | POA: Diagnosis not present

## 2017-06-25 DIAGNOSIS — M25612 Stiffness of left shoulder, not elsewhere classified: Secondary | ICD-10-CM | POA: Diagnosis not present

## 2017-06-25 DIAGNOSIS — M25512 Pain in left shoulder: Secondary | ICD-10-CM | POA: Diagnosis not present

## 2017-06-30 ENCOUNTER — Ambulatory Visit: Payer: Medicare Other | Admitting: Family Medicine

## 2017-06-30 ENCOUNTER — Ambulatory Visit (INDEPENDENT_AMBULATORY_CARE_PROVIDER_SITE_OTHER): Payer: Medicare Other | Admitting: Family Medicine

## 2017-06-30 ENCOUNTER — Encounter: Payer: Self-pay | Admitting: Family Medicine

## 2017-06-30 VITALS — BP 111/81 | HR 83 | Ht 64.0 in | Wt 116.6 lb

## 2017-06-30 DIAGNOSIS — F333 Major depressive disorder, recurrent, severe with psychotic symptoms: Secondary | ICD-10-CM | POA: Diagnosis not present

## 2017-06-30 DIAGNOSIS — M25512 Pain in left shoulder: Secondary | ICD-10-CM | POA: Diagnosis not present

## 2017-06-30 DIAGNOSIS — F209 Schizophrenia, unspecified: Secondary | ICD-10-CM | POA: Diagnosis not present

## 2017-06-30 DIAGNOSIS — F411 Generalized anxiety disorder: Secondary | ICD-10-CM | POA: Diagnosis not present

## 2017-06-30 DIAGNOSIS — Z0000 Encounter for general adult medical examination without abnormal findings: Secondary | ICD-10-CM | POA: Diagnosis not present

## 2017-06-30 MED ORDER — CITALOPRAM HYDROBROMIDE 20 MG PO TABS: 20.0000 mg | ORAL_TABLET | Freq: Every day | ORAL | 3 refills | Status: DC

## 2017-06-30 MED ORDER — QUETIAPINE FUMARATE ER 50 MG PO TB24: 50.0000 mg | ORAL_TABLET | Freq: Every day | ORAL | 3 refills | Status: DC

## 2017-06-30 NOTE — Patient Instructions (Addendum)
[facility] Address: [address], [place],  [address]  Phone: [phone]    Preventative Services:  AAA screening: N/A Health Risk Assessment and Personalized Prevention Plan: Done today Bone Mass Measurements: N/A Breast Cancer Screening: N/A CVD Screening: Done previously Cervical Cancer Screening: Up to date Colon Cancer Screening: N/A Depression Screening: Done today Diabetes Screening: Done previously Glaucoma Screening: N/A Hepatitis B vaccine: N/A Hepatitis C screening: Up to date HIV Screening: Up to date Flu Vaccine: Refused Lung cancer Screening: N/A Obesity Screening: done today Pneumonia Vaccines (2): Refused STI Screening: Done previously  Health Maintenance, Female Adopting a healthy lifestyle and getting preventive care can go a long way to promote health and wellness. Talk with your health care provider about what schedule of regular examinations is right for you. This is a good chance for you to check in with your provider about disease prevention and staying healthy. In between checkups, there are plenty of things you can do on your own. Experts have done a lot of research about which lifestyle changes and preventive measures are most likely to keep you healthy. Ask your health care provider for more information. Weight and diet Eat a healthy diet  Be sure to include plenty of vegetables, fruits, low-fat dairy products, and lean protein.  Do not eat a lot of foods high in solid fats, added sugars, or salt.  Get regular exercise. This is one of the most important things you can do for your health. ? Most adults should exercise for at least 150 minutes each week. The exercise should increase your heart rate and make you sweat (moderate-intensity exercise). ? Most adults should also do strengthening exercises at least twice a week. This is in addition to the moderate-intensity exercise.  Maintain a healthy weight  Body mass index (BMI)  is a measurement that can be used to identify possible weight problems. It estimates body fat based on height and weight. Your health care provider can help determine your BMI and help you achieve or maintain a healthy weight.  For females 69 years of age and older: ? A BMI below 18.5 is considered underweight. ? A BMI of 18.5 to 24.9 is normal. ? A BMI of 25 to 29.9 is considered overweight. ? A BMI of 30 and above is considered obese.  Watch levels of cholesterol and blood lipids  You should start having your blood tested for lipids and cholesterol at 34 years of age, then have this test every 5 years.  You may need to have your cholesterol levels checked more often if: ? Your lipid or cholesterol levels are high. ? You are older than 34 years of age. ? You are at high risk for heart disease.  Cancer screening Lung Cancer  Lung cancer screening is recommended for adults 71-81 years old who are at high risk for lung cancer because of a history of smoking.  A yearly low-dose CT scan of the lungs is recommended for people who: ? Currently smoke. ? Have quit within the past 15 years. ? Have at least a 30-pack-year history of smoking. A pack year is smoking an average of one pack of cigarettes a day for 1 year.  Yearly screening should continue until it has been 15 years since you quit.  Yearly screening should stop if you develop a health problem that would prevent you from having lung cancer treatment.  Breast Cancer  Practice breast self-awareness. This means understanding how your breasts normally appear and  feel.  It also means doing regular breast self-exams. Let your health care provider know about any changes, no matter how small.  If you are in your 20s or 30s, you should have a clinical breast exam (CBE) by a health care provider every 1-3 years as part of a regular health exam.  If you are 19 or older, have a CBE every year. Also consider having a breast X-ray  (mammogram) every year.  If you have a family history of breast cancer, talk to your health care provider about genetic screening.  If you are at high risk for breast cancer, talk to your health care provider about having an MRI and a mammogram every year.  Breast cancer gene (BRCA) assessment is recommended for women who have family members with BRCA-related cancers. BRCA-related cancers include: ? Breast. ? Ovarian. ? Tubal. ? Peritoneal cancers.  Results of the assessment will determine the need for genetic counseling and BRCA1 and BRCA2 testing.  Cervical Cancer Your health care provider may recommend that you be screened regularly for cancer of the pelvic organs (ovaries, uterus, and vagina). This screening involves a pelvic examination, including checking for microscopic changes to the surface of your cervix (Pap test). You may be encouraged to have this screening done every 3 years, beginning at age 48.  For women ages 71-65, health care providers may recommend pelvic exams and Pap testing every 3 years, or they may recommend the Pap and pelvic exam, combined with testing for human papilloma virus (HPV), every 5 years. Some types of HPV increase your risk of cervical cancer. Testing for HPV may also be done on women of any age with unclear Pap test results.  Other health care providers may not recommend any screening for nonpregnant women who are considered low risk for pelvic cancer and who do not have symptoms. Ask your health care provider if a screening pelvic exam is right for you.  If you have had past treatment for cervical cancer or a condition that could lead to cancer, you need Pap tests and screening for cancer for at least 20 years after your treatment. If Pap tests have been discontinued, your risk factors (such as having a new sexual partner) need to be reassessed to determine if screening should resume. Some women have medical problems that increase the chance of getting  cervical cancer. In these cases, your health care provider may recommend more frequent screening and Pap tests.  Colorectal Cancer  This type of cancer can be detected and often prevented.  Routine colorectal cancer screening usually begins at 34 years of age and continues through 34 years of age.  Your health care provider may recommend screening at an earlier age if you have risk factors for colon cancer.  Your health care provider may also recommend using home test kits to check for hidden blood in the stool.  A small camera at the end of a tube can be used to examine your colon directly (sigmoidoscopy or colonoscopy). This is done to check for the earliest forms of colorectal cancer.  Routine screening usually begins at age 15.  Direct examination of the colon should be repeated every 5-10 years through 34 years of age. However, you may need to be screened more often if early forms of precancerous polyps or small growths are found.  Skin Cancer  Check your skin from head to toe regularly.  Tell your health care provider about any new moles or changes in moles, especially if there  is a change in a mole's shape or color.  Also tell your health care provider if you have a mole that is larger than the size of a pencil eraser.  Always use sunscreen. Apply sunscreen liberally and repeatedly throughout the day.  Protect yourself by wearing long sleeves, pants, a wide-brimmed hat, and sunglasses whenever you are outside.  Heart disease, diabetes, and high blood pressure  High blood pressure causes heart disease and increases the risk of stroke. High blood pressure is more likely to develop in: ? People who have blood pressure in the high end of the normal range (130-139/85-89 mm Hg). ? People who are overweight or obese. ? People who are African American.  If you are 22-85 years of age, have your blood pressure checked every 3-5 years. If you are 80 years of age or older, have your  blood pressure checked every year. You should have your blood pressure measured twice-once when you are at a hospital or clinic, and once when you are not at a hospital or clinic. Record the average of the two measurements. To check your blood pressure when you are not at a hospital or clinic, you can use: ? An automated blood pressure machine at a pharmacy. ? A home blood pressure monitor.  If you are between 46 years and 69 years old, ask your health care provider if you should take aspirin to prevent strokes.  Have regular diabetes screenings. This involves taking a blood sample to check your fasting blood sugar level. ? If you are at a normal weight and have a low risk for diabetes, have this test once every three years after 34 years of age. ? If you are overweight and have a high risk for diabetes, consider being tested at a younger age or more often. Preventing infection Hepatitis B  If you have a higher risk for hepatitis B, you should be screened for this virus. You are considered at high risk for hepatitis B if: ? You were born in a country where hepatitis B is common. Ask your health care provider which countries are considered high risk. ? Your parents were born in a high-risk country, and you have not been immunized against hepatitis B (hepatitis B vaccine). ? You have HIV or AIDS. ? You use needles to inject street drugs. ? You live with someone who has hepatitis B. ? You have had sex with someone who has hepatitis B. ? You get hemodialysis treatment. ? You take certain medicines for conditions, including cancer, organ transplantation, and autoimmune conditions.  Hepatitis C  Blood testing is recommended for: ? Everyone born from [birth year] through 1963. ? Anyone with known risk factors for hepatitis C.  Sexually transmitted infections (STIs)  You should be screened for sexually transmitted infections (STIs) including gonorrhea and chlamydia if: ? You are sexually active and  are younger than 34 years of age. ? You are older than 34 years of age and your health care provider tells you that you are at risk for this type of infection. ? Your sexual activity has changed since you were last screened and you are at an increased risk for chlamydia or gonorrhea. Ask your health care provider if you are at risk.  If you do not have HIV, but are at risk, it may be recommended that you take a prescription medicine daily to prevent HIV infection. This is called pre-exposure prophylaxis (PrEP). You are considered at risk if: ? You are sexually active and do  not regularly use condoms or know the HIV status of your partner(s). ? You take drugs by injection. ? You are sexually active with a partner who has HIV.  Talk with your health care provider about whether you are at high risk of being infected with HIV. If you choose to begin PrEP, you should first be tested for HIV. You should then be tested every 3 months for as long as you are taking PrEP. Pregnancy  If you are premenopausal and you may become pregnant, ask your health care provider about preconception counseling.  If you may become pregnant, take 400 to 800 micrograms (mcg) of folic acid every day.  If you want to prevent pregnancy, talk to your health care provider about birth control (contraception). Osteoporosis and menopause  Osteoporosis is a disease in which the bones lose minerals and strength with aging. This can result in serious bone fractures. Your risk for osteoporosis can be identified using a bone density scan.  If you are 59 years of age or older, or if you are at risk for osteoporosis and fractures, ask your health care provider if you should be screened.  Ask your health care provider whether you should take a calcium or vitamin D supplement to lower your risk for osteoporosis.  Menopause may have certain physical symptoms and risks.  Hormone replacement therapy may reduce some of these symptoms and  risks. Talk to your health care provider about whether hormone replacement therapy is right for you. Follow these instructions at home:  Schedule regular health, dental, and eye exams.  Stay current with your immunizations.  Do not use any tobacco products including cigarettes, chewing tobacco, or electronic cigarettes.  If you are pregnant, do not drink alcohol.  If you are breastfeeding, limit how much and how often you drink alcohol.  Limit alcohol intake to no more than 1 drink per day for nonpregnant women. One drink equals 12 ounces of beer, 5 ounces of wine, or 1 ounces of hard liquor.  Do not use street drugs.  Do not share needles.  Ask your health care provider for help if you need support or information about quitting drugs.  Tell your health care provider if you often feel depressed.  Tell your health care provider if you have ever been abused or do not feel safe at home. This information is not intended to replace advice given to you by your health care provider. Make sure you discuss any questions you have with your health care provider. Document Released: 03/04/2011 Document Revised: 01/25/2016 Document Reviewed: 05/23/2015 Elsevier Interactive Patient Education  2018 [place] floaters are specks of material that float around inside your eye. A jelly-like fluid (vitreous) fills the inside of your eye. The vitreous is normally clear. It allows light to pass through to tissues at the back of the eye (retina). The retina contains the nerves needed for vision. Your vitreous can start to shrink and become stringy as you age. Strands of material may start to float around inside the eye. They come from clumps of cells, blood, or other materials. These objects cast shadows on the retina and show up as floaters. Floaters may be more obvious when you look up at the sky or at a bright, blank background. They do not go away completely. In time, however, they  may settle below your line of sight. Floaters can be annoying. They do not usually cause vision problems. Sometimes floaters appear along with flashes. Flashes look  like bright, quick streaks of light. They usually occur at the edge of your vision. Flashes result when your vitreous pulls on your retina. They also occur with age. However, they could be a warning sign of a detached retina. This is a serious condition that requires emergency treatment to prevent vision loss. What are the causes? For most people, eye floaters develop when the vitreous begins to shrink as a normal part of aging. More serious causes of floaters include:  A torn retina.  Injury.  Bleeding inside the eye. Diabetes and other conditions can cause broken retinal blood vessels.  A blood clot in the major vein of the retina or its branches (retinal vein occlusion).  Retinal detachment.  Vitreous detachment.  Inflammation inside the eye (uveitis).  Infection inside the eye.  What increases the risk? You may have a higher risk for floaters if:  You are older.  You are nearsighted.  You have diabetes.  You have had cataracts removed.  What are the signs or symptoms? Symptoms of floaters include seeing small, shadowy shapes move across your vision. They move as your eyes move. They drift out of your vision when you keep your eyes still. These shapes may look like:  Specks.  Dots.  Circles.  Squiggly lines.  Thread.  Symptoms of flashes include seeing:  Bursts of light.  Flashing lights.  Lightning streaks.  What is commonly referred to as "stars."  How is this diagnosed? Your health care provider may diagnose floaters and flashes based on your symptoms. You may need to see an eye care specialist (optometrist or ophthalmologist). The specialist will do an exam to determine whether your floaters are a normal part of aging or a warning sign of a more serious eye problem. The specialist may put  drops in your eyes to open your pupils wide (dilate) and then use a special scope (slit lamp) to look inside your eye. How is this treated? No treatment is needed for floaters that occur normally with age. Sometimes floaters become severe enough to affect your vision. In rare cases, surgery to remove the vitreous and replace it with a saltwater solution (vitrectomy) may be considered. Follow these instructions at home: Keep all follow-up visits as directed by your health care provider. This is important. Contact a health care provider if:  You have a sudden increase in floaters.  You have floaters along with flashes.  You have floaters along with any new eye symptoms. Get help right away if:  You have a sudden increase in floaters or flashes that interferes with your vision.  Your vision suddenly changes. This information is not intended to replace advice given to you by your health care provider. Make sure you discuss any questions you have with your health care provider. Document Released: 08/22/2003 Document Revised: 01/25/2016 Document Reviewed: 04/13/2014 Elsevier Interactive Patient Education  2018 [place].  Back Exercises The following exercises strengthen the muscles that help to support the back. They also help to keep the lower back flexible. Doing these exercises can help to prevent back pain or lessen existing pain. If you have back pain or discomfort, try doing these exercises 2-3 times each day or as told by your health care provider. When the pain goes away, do them once each day, but increase the number of times that you repeat the steps for each exercise (do more repetitions). If you do not have back pain or discomfort, do these exercises once each day or as told by  your health care provider. Exercises Single Knee to Chest  Repeat these steps 3-5 times for each leg: 1. Lie on your back on a firm bed or the floor with your legs extended. 2. Bring one knee to your  chest. Your other leg should stay extended and in contact with the floor. 3. Hold your knee in place by grabbing your knee or thigh. 4. Pull on your knee until you feel a gentle stretch in your lower back. 5. Hold the stretch for 10-30 seconds. 6. Slowly release and straighten your leg.  Pelvic Tilt  Repeat these steps 5-10 times: 1. Lie on your back on a firm bed or the floor with your legs extended. 2. Bend your knees so they are pointing toward the ceiling and your feet are flat on the floor. 3. Tighten your lower abdominal muscles to press your lower back against the floor. This motion will tilt your pelvis so your tailbone points up toward the ceiling instead of pointing to your feet or the floor. 4. With gentle tension and even breathing, hold this position for 5-10 seconds.  Cat-Cow  Repeat these steps until your lower back becomes more flexible: 1. Get into a hands-and-knees position on a firm surface. Keep your hands under your shoulders, and keep your knees under your hips. You may place padding under your knees for comfort. 2. Let your head hang down, and point your tailbone toward the floor so your lower back becomes rounded like the back of a cat. 3. Hold this position for 5 seconds. 4. Slowly lift your head and point your tailbone up toward the ceiling so your back forms a sagging arch like the back of a cow. 5. Hold this position for 5 seconds.  Press-Ups  Repeat these steps 5-10 times: 1. Lie on your abdomen (face-down) on the floor. 2. Place your palms near your head, about shoulder-width apart. 3. While you keep your back as relaxed as possible and keep your hips on the floor, slowly straighten your arms to raise the top half of your body and lift your shoulders. Do not use your back muscles to raise your upper torso. You may adjust the placement of your hands to make yourself more comfortable. 4. Hold this position for 5 seconds while you keep your back  relaxed. 5. Slowly return to lying flat on the floor.  Bridges  Repeat these steps 10 times: 1. Lie on your back on a firm surface. 2. Bend your knees so they are pointing toward the ceiling and your feet are flat on the floor. 3. Tighten your buttocks muscles and lift your buttocks off of the floor until your waist is at almost the same height as your knees. You should feel the muscles working in your buttocks and the back of your thighs. If you do not feel these muscles, slide your feet 1-2 inches farther away from your buttocks. 4. Hold this position for 3-5 seconds. 5. Slowly lower your hips to the starting position, and allow your buttocks muscles to relax completely.  If this exercise is too easy, try doing it with your arms crossed over your chest. Abdominal Crunches  Repeat these steps 5-10 times: 1. Lie on your back on a firm bed or the floor with your legs extended. 2. Bend your knees so they are pointing toward the ceiling and your feet are flat on the floor. 3. Cross your arms over your chest. 4. Tip your chin slightly toward your chest without bending  your neck. 5. Tighten your abdominal muscles and slowly raise your trunk (torso) high enough to lift your shoulder blades a tiny bit off of the floor. Avoid raising your torso higher than that, because it can put too much stress on your low back and it does not help to strengthen your abdominal muscles. 6. Slowly return to your starting position.  Back Lifts Repeat these steps 5-10 times: 1. Lie on your abdomen (face-down) with your arms at your sides, and rest your forehead on the floor. 2. Tighten the muscles in your legs and your buttocks. 3. Slowly lift your chest off of the floor while you keep your hips pressed to the floor. Keep the back of your head in line with the curve in your back. Your eyes should be looking at the floor. 4. Hold this position for 3-5 seconds. 5. Slowly return to your starting position.  Contact  a health care provider if:  Your back pain or discomfort gets much worse when you do an exercise.  Your back pain or discomfort does not lessen within 2 hours after you exercise. If you have any of these problems, stop doing these exercises right away. Do not do them again unless your health care provider says that you can. Get help right away if:  You develop sudden, severe back pain. If this happens, stop doing the exercises right away. Do not do them again unless your health care provider says that you can. This information is not intended to replace advice given to you by your health care provider. Make sure you discuss any questions you have with your health care provider. Document Released: 09/26/2004 Document Revised: 12/27/2015 Document Reviewed: 10/13/2014 Elsevier Interactive Patient Education  2017 [employer].

## 2017-06-30 NOTE — Progress Notes (Signed)
BP 111/81 (BP Location: Left Arm, Patient Position: Sitting, Cuff Size: Normal)   Pulse 83   Ht 5\' 4"  (1.626 m)   Wt 116 lb 9 oz (52.9 kg)   LMP 06/29/2017   SpO2 99%   BMI 20.01 kg/m    Subjective:    Patient ID: Jeanette Wilson, female    DOB: [birth year], 36 y.o.   MRN: [id number]  HPI: Jeanette Wilson is a 36 y.o. female presenting on 06/30/2017 for comprehensive medical examination. Current medical complaints include:  SHOULDER PAIN- still doing therapy, has been hard to get in because of work. Using the resistance bands.  Duration: 4 weeks Involved shoulder: left Mechanism of injury: trauma Location: posterior and anterior Onset:sudden Severity: 6/10  Quality:  Sharp aching Frequency: constant Radiation: yes Aggravating factors: movement  Alleviating factors: shower   Status: stable Treatments attempted: muscle relaxer, rest, ice and heat  Relief with NSAIDs?:  no Weakness: yes Numbness: no Decreased grip strength: yes Redness: no Swelling: no Bruising: no Fevers: no  DEPRESSION Mood status: exacerbated Satisfied with current treatment?: yes Symptom severity: severe  Duration of current treatment : Has been off medicine for a while- hasn't seen her counselor or psychiatrist in quite some tome Side effects: no Medication compliance: poor compliance Psychotherapy/counseling: no in the past Previous psychiatric medications: seroquel Depressed mood: yes Anxious mood: yes Anhedonia: no Significant weight loss or gain: no Insomnia: yes hard to fall asleep Fatigue: yes Feelings of worthlessness or guilt: yes Impaired concentration/indecisiveness: yes Suicidal ideations: yes- passive only, not currently Hopelessness: yes Crying spells: yes Depression screen [name] 2/9 06/30/2017 05/27/2017 05/20/2017 07/13/2015  Decreased Interest 3 0 0 2  Down, Depressed, Hopeless 3 3 3 3   PHQ - 2 Score 6 3 3 5   Altered sleeping 3 3 3 3   Tired, decreased energy 2 3 3 2     Change in appetite 2 1 1 3   Feeling bad or failure about yourself  2 3 3 3   Trouble concentrating 2 1 1 3   Moving slowly or fidgety/restless 2 0 0 3  Suicidal thoughts 2 0 1 3  PHQ-9 Score 21 14 15 25   Difficult doing work/chores Very difficult Somewhat difficult Not difficult at all -   She currently lives with: her kids Menopausal Symptoms: no  Functional Status Survey: Is the patient deaf or have difficulty hearing?: No Does the patient have difficulty seeing, even when wearing glasses/contacts?: Yes Does the patient have difficulty concentrating, remembering, or making decisions?: Yes Does the patient have difficulty walking or climbing stairs?: No Does the patient have difficulty dressing or bathing?: No Does the patient have difficulty doing errands alone such as visiting a doctor's office or shopping?: No  Fall Risk  05/27/2017 05/20/2017  Falls in the past year? No No    Depression Screen Depression screen [facility] 2/9 06/30/2017 05/27/2017 05/20/2017 07/13/2015  Decreased Interest 3 0 0 2  Down, Depressed, Hopeless 3 3 3 3   PHQ - 2 Score 6 3 3 5   Altered sleeping 3 3 3 3   Tired, decreased energy 2 3 3 2   Change in appetite 2 1 1 3   Feeling bad or failure about yourself  2 3 3 3   Trouble concentrating 2 1 1 3   Moving slowly or fidgety/restless 2 0 0 3  Suicidal thoughts 2 0 1 3  PHQ-9 Score 21 14 15 25   Difficult doing work/chores Very difficult Somewhat difficult Not difficult at all -   Advanced Directives  Does patient have a HCPOA?    no Does patient have a living will or MOST form?  no  Past Medical History:  Past Medical History:  Diagnosis Date  . Anxiety   . Depression   . Schizophrenia (HCC)   . UTI (urinary tract infection) tubiligation    Surgical History:  Past Surgical History:  Procedure Laterality Date  . TONSILLECTOMY    . TUBAL LIGATION    . WISDOM TOOTH EXTRACTION      Medications:  Current Outpatient Prescriptions on File Prior to Visit   Medication Sig  . methocarbamol (ROBAXIN) 500 MG tablet take 1 tablet by mouth every 8 hours if needed for muscle spasm  . naproxen (NAPROSYN) 500 MG tablet Take 1 tablet (500 mg total) by mouth 2 (two) times daily with a meal.   No current facility-administered medications on file prior to visit.     Allergies:  Allergies  Allergen Reactions  . Wheat Bran Itching    Social History:  Social History   Social History  . Marital status: Single    Spouse name: N/A  . Number of children: N/A  . Years of education: N/A   Occupational History  . Not on file.   Social History Main Topics  . Smoking status: Former Smoker    Packs/day: 0.25    Quit date: 09/03/2015  . Smokeless tobacco: Never Used  . Alcohol use Yes     Comment: social drinking  5th of liquor weekly  . Drug use: Yes    Frequency: 1.0 time per week    Types: Marijuana  . Sexual activity: Yes    Birth control/ protection: Surgical, Condom   Other Topics Concern  . Not on file   Social History Narrative   ** Merged History Encounter **       History  Smoking Status  . Former Smoker  . Packs/day: 0.25  . Quit date: 09/03/2015  Smokeless Tobacco  . Never Used   History  Alcohol Use  . Yes    Comment: social drinking  5th of liquor weekly    Family History:  Family History  Problem Relation Age of Onset  . Cancer Mother   . Hypertension Mother   . Diabetes Mother   . Asthma Sister   . ADD / ADHD Brother   . Schizophrenia Brother   . Bipolar disorder Brother   . Hypertension Brother   . Cirrhosis Brother   . Asthma Daughter   . Diabetes Maternal Grandmother   . Hypertension Maternal Grandmother   . Diabetes Paternal Grandmother   . Heart disease Paternal Grandmother   . Heart disease Brother   . Anesthesia problems Neg Hx   . Hypotension Neg Hx   . Malignant hyperthermia Neg Hx   . Pseudochol deficiency Neg Hx     Past medical history, surgical history, medications, allergies, family  history and social history reviewed with patient today and changes made to appropriate areas of the chart.   Review of Systems  Constitutional: Positive for chills and diaphoresis. Negative for fever, malaise/fatigue and weight loss.  HENT: Negative.   Eyes: Positive for blurred vision. Negative for double vision, photophobia, pain, discharge and redness.  Respiratory: Negative.   Cardiovascular: Negative.   Gastrointestinal: Positive for abdominal pain and nausea. Negative for blood in stool, constipation, diarrhea, heartburn, melena and vomiting.  Genitourinary: Negative.   Musculoskeletal: Positive for back pain and joint pain. Negative for falls, myalgias and neck pain.  Skin: Negative.   Neurological: Negative.  Negative for weakness.  Endo/Heme/Allergies: Negative for environmental allergies and polydipsia. Bruises/bleeds easily.  Psychiatric/Behavioral: Positive for depression. Negative for hallucinations, memory loss, substance abuse and suicidal ideas. The patient is nervous/anxious and has insomnia.     All other ROS negative except what is listed above and in the HPI.      Objective:    BP 111/81 (BP Location: Left Arm, Patient Position: Sitting, Cuff Size: Normal)   Pulse 83   Ht 5\' 4"  (1.626 m)   Wt 116 lb 9 oz (52.9 kg)   LMP 06/29/2017   SpO2 99%   BMI 20.01 kg/m   Wt Readings from Last 3 Encounters:  06/30/17 116 lb 9 oz (52.9 kg)  05/29/17 117 lb 2 oz (53.1 kg)  05/19/17 122 lb (55.3 kg)     Visual Acuity Screening   Right eye Left eye Both eyes  Without correction:     With correction: 20/15 20/20 20/20     Physical Exam  Constitutional: She is oriented to person, place, and time. She appears well-developed and well-nourished. No distress.  HENT:  Head: Normocephalic and atraumatic.  Right Ear: Hearing, tympanic membrane, external ear and ear canal normal.  Left Ear: Hearing, tympanic membrane, external ear and ear canal normal.  Nose: Nose normal.    Mouth/Throat: Uvula is midline, oropharynx is clear and moist and mucous membranes are normal. No oropharyngeal exudate.  Eyes: Pupils are equal, round, and reactive to light. Conjunctivae, EOM and lids are normal. Right eye exhibits no discharge. Left eye exhibits no discharge. No scleral icterus.  Neck: Normal range of motion. Neck supple. No JVD present. No tracheal deviation present. No thyromegaly present.  Cardiovascular: Normal rate, regular rhythm, normal heart sounds and intact distal pulses.  Exam reveals no gallop and no friction rub.   No murmur heard. Pulmonary/Chest: Effort normal and breath sounds normal. No stridor. No respiratory distress. She has no wheezes. She has no rales. She exhibits no tenderness.  Abdominal: Soft. Bowel sounds are normal. She exhibits no distension and no mass. There is no tenderness. There is no rebound and no guarding.  Genitourinary:  Genitourinary Comments: Breast and pelvic exams deferred with shared decision making  Musculoskeletal: She exhibits tenderness (to L shoulder with decreased ROM). She exhibits no edema or deformity.  Lymphadenopathy:    She has no cervical adenopathy.  Neurological: She is alert and oriented to person, place, and time. She has normal reflexes. She displays normal reflexes. No cranial nerve deficit. She exhibits normal muscle tone. Coordination normal.  Skin: Skin is warm, dry and intact. No rash noted. She is not diaphoretic. No erythema. No pallor.  Psychiatric: She has a normal mood and affect. Her speech is normal and behavior is normal. Judgment and thought content normal. Cognition and memory are normal.    6CIT Screen 06/30/2017  What Year? 0 points  What month? 0 points  What time? 0 points  Count back from 20 0 points  Months in reverse 0 points  Repeat phrase 4 points  Total Score 4     Results for orders placed or performed in visit on 04/30/17  TSH  Result Value Ref Range   TSH 0.716 0.450 - 4.500  uIU/mL  Comprehensive metabolic panel  Result Value Ref Range   Glucose 85 65 - 99 mg/dL   BUN 15 6 - 20 mg/dL   Creatinine, Ser 7.82 0.57 - 1.00 mg/dL   GFR calc non  Af Amer 80 >59 mL/min/1.73   GFR calc Af Amer 93 >59 mL/min/1.73   BUN/Creatinine Ratio 16 9 - 23   Sodium 138 134 - 144 mmol/L   Potassium 4.3 3.5 - 5.2 mmol/L   Chloride 102 96 - 106 mmol/L   CO2 20 20 - 29 mmol/L   Calcium 9.6 8.7 - 10.2 mg/dL   Total Protein 7.8 6.0 - 8.5 g/dL   Albumin 4.8 3.5 - 5.5 g/dL   Globulin, Total 3.0 1.5 - 4.5 g/dL   Albumin/Globulin Ratio 1.6 1.2 - 2.2   Bilirubin Total 0.8 0.0 - 1.2 mg/dL   Alkaline Phosphatase 35 (L) 39 - 117 IU/L   AST 13 0 - 40 IU/L   ALT 8 0 - 32 IU/L  CBC with Differential/Platelet  Result Value Ref Range   WBC 5.6 3.4 - 10.8 x10E3/uL   RBC 4.19 3.77 - 5.28 x10E6/uL   Hemoglobin 13.1 11.1 - 15.9 g/dL   Hematocrit 16.1 09.6 - 46.6 %   MCV 97 79 - 97 fL   MCH 31.3 26.6 - 33.0 pg   MCHC 32.3 31.5 - 35.7 g/dL   RDW 04.5 40.9 - 81.1 %   Platelets 299 150 - 379 x10E3/uL   Neutrophils 59 Not Estab. %   Lymphs 33 Not Estab. %   Monocytes 8 Not Estab. %   Eos 0 Not Estab. %   Basos 0 Not Estab. %   Neutrophils Absolute 3.3 1.4 - 7.0 x10E3/uL   Lymphocytes Absolute 1.9 0.7 - 3.1 x10E3/uL   Monocytes Absolute 0.5 0.1 - 0.9 x10E3/uL   EOS (ABSOLUTE) 0.0 0.0 - 0.4 x10E3/uL   Basophils Absolute 0.0 0.0 - 0.2 x10E3/uL   Immature Granulocytes 0 Not Estab. %   Immature Grans (Abs) 0.0 0.0 - 0.1 x10E3/uL  VITAMIN D 25 Hydroxy (Vit-D Deficiency, Fractures)  Result Value Ref Range   Vit D, 25-Hydroxy 17.7 (L) 30.0 - 100.0 ng/mL  Lipid Panel w/o Chol/HDL Ratio  Result Value Ref Range   Cholesterol, Total 185 100 - 199 mg/dL   Triglycerides 54 0 - 149 mg/dL   HDL 87 >91 mg/dL   VLDL Cholesterol Cal 11 5 - 40 mg/dL   LDL Calculated 87 0 - 99 mg/dL  Lyme Ab/Western Blot Reflex  Result Value Ref Range   Lyme IgG/IgM Ab <0.91 0.00 - 0.90 ISR   LYME DISEASE AB, QUANT,  IGM <0.80 0.00 - 0.79 index  Rocky mtn spotted fvr abs pnl(IgG+IgM)  Result Value Ref Range   RMSF IgG Negative Negative   RMSF IgM 0.76 0.00 - 0.89 index  Ehrlichia Antibody Panel  Result Value Ref Range   E.Chaffeensis (HME) IgG Negative Neg:<1:64   E. Chaffeensis (HME) IgM Titer Negative Neg:<1:20   HGE IgG Titer Negative Neg:<1:64   HGE IgM Titer Negative Neg:<1:20  Babesia microti Antibody Panel  Result Value Ref Range   Babesia microti IgM <1:10 Neg:<1:10   Babesia microti IgG <1:10 Neg:<1:10      Assessment & Plan:   Problem List Items Addressed This Visit      Other   Major depressive disorder, recurrent, severe with psychotic features (HCC)    Not under good control. Has not seen her counselor in a while. Has not seen psychiatry. Information about [facility] given today- she states that she will go tomorrow. Note for work given. Restarted previous psych medicines. Call with any concerns. Recheck 2 weeks.       Relevant Medications   citalopram (  CELEXA) 20 MG tablet   Generalized anxiety disorder    Not under good control. Has not seen her counselor in a while. Has not seen psychiatry. Information about [facility] given today- she states that she will go tomorrow. Note for work given. Restarted previous psych medicines. Call with any concerns. Recheck 2 weeks.       Relevant Medications   citalopram (CELEXA) 20 MG tablet   Schizophrenia (HCC)    Not under good control. Has not seen her counselor in a while. Has not seen psychiatry. Information about [facility] given today- she states that she will go tomorrow. Note for work given. Restarted previous psych medicines. Call with any concerns. Recheck 2 weeks.        Other Visit Diagnoses    Medicare annual wellness visit, subsequent    -  Primary   Preventative care discussed as below. Call with any concerns.    Routine general medical examination at a health care facility       Vaccines declined. Pap up to date. Screening labs last visit.  Continue diet and exercise. Call with any concerns.    Acute pain of left shoulder       Not getting much better with PT- would like to see ortho. Referral generated today.   Relevant Orders   Ambulatory referral to Orthopedic Surgery       Preventative Services:  AAA screening: N/A Health Risk Assessment and Personalized Prevention Plan: Done today Bone Mass Measurements: N/A Breast Cancer Screening: N/A CVD Screening: Done previously Cervical Cancer Screening: Up to date Colon Cancer Screening: N/A Depression Screening: Done today Diabetes Screening: Done previously Glaucoma Screening: N/A Hepatitis B vaccine: N/A Hepatitis C screening: Up to date HIV Screening: Up to date Flu Vaccine: Refused Lung cancer Screening: N/A Obesity Screening: done today Pneumonia Vaccines (2): Refused STI Screening: Done previously  Follow up plan: Return in about 2 weeks (around 07/14/2017) for follow up mood and back pain.   LABORATORY TESTING:  - Pap smear: up to date  IMMUNIZATIONS:   - Tdap: Tetanus vaccination status reviewed: last tetanus booster within 10 years. - Influenza: Refused - Pneumovax: Refused  PATIENT COUNSELING:   Advised to take 1 mg of folate supplement per day if capable of pregnancy.   Sexuality: Discussed sexually transmitted diseases, partner selection, use of condoms, avoidance of unintended pregnancy  and contraceptive alternatives.   Advised to avoid cigarette smoking.  I discussed with the patient that most people either abstain from alcohol or drink within safe limits (<=14/week and <=4 drinks/occasion for males, <=7/weeks and <= 3 drinks/occasion for females) and that the risk for alcohol disorders and other health effects rises proportionally with the number of drinks per week and how often a drinker exceeds daily limits.  Discussed cessation/primary prevention of drug use and availability of treatment for abuse.   Diet: Encouraged to adjust caloric  intake to maintain  or achieve ideal body weight, to reduce intake of dietary saturated fat and total fat, to limit sodium intake by avoiding high sodium foods and not adding table salt, and to maintain adequate dietary potassium and calcium preferably from fresh fruits, vegetables, and low-fat dairy products.    stressed the importance of regular exercise  Injury prevention: Discussed safety belts, safety helmets, smoke detector, smoking near bedding or upholstery.   Dental health: Discussed importance of regular tooth brushing, flossing, and dental visits.    NEXT PREVENTATIVE PHYSICAL DUE IN 1 YEAR. Return in about 2 weeks (around  07/14/2017) for follow up mood and back pain.

## 2017-06-30 NOTE — Assessment & Plan Note (Addendum)
Not under good control. Has not seen her counselor in a while. Has not seen psychiatry. Information about Jeanette Wilson given today- she states that she will go tomorrow. Note for work given. Restarted previous psych medicines. Call with any concerns. Recheck 2 weeks.

## 2017-06-30 NOTE — Assessment & Plan Note (Addendum)
Not under good control. Has not seen her counselor in a while. Has not seen psychiatry. Information about [facility] given today- she states that she will go tomorrow. Note for work given. Restarted previous psych medicines. Call with any concerns. Recheck 2 weeks.  

## 2017-07-07 ENCOUNTER — Encounter (INDEPENDENT_AMBULATORY_CARE_PROVIDER_SITE_OTHER): Payer: Self-pay | Admitting: Orthopedic Surgery

## 2017-07-07 ENCOUNTER — Ambulatory Visit (INDEPENDENT_AMBULATORY_CARE_PROVIDER_SITE_OTHER): Payer: Medicare Other | Admitting: Orthopedic Surgery

## 2017-07-07 DIAGNOSIS — M25512 Pain in left shoulder: Secondary | ICD-10-CM | POA: Diagnosis not present
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
## 2017-07-14 ENCOUNTER — Ambulatory Visit: Payer: Medicare Other | Admitting: Family Medicine

## 2017-07-15 ENCOUNTER — Ambulatory Visit (INDEPENDENT_AMBULATORY_CARE_PROVIDER_SITE_OTHER): Payer: Medicare Other | Admitting: Family Medicine

## 2017-07-15 ENCOUNTER — Encounter: Payer: Self-pay | Admitting: Family Medicine

## 2017-07-15 VITALS — BP 117/78 | HR 80 | Temp 98.7°F | Wt 118.1 lb

## 2017-07-15 DIAGNOSIS — F333 Major depressive disorder, recurrent, severe with psychotic symptoms: Secondary | ICD-10-CM

## 2017-07-15 DIAGNOSIS — G8929 Other chronic pain: Secondary | ICD-10-CM

## 2017-07-15 DIAGNOSIS — F209 Schizophrenia, unspecified: Secondary | ICD-10-CM

## 2017-07-15 DIAGNOSIS — M545 Low back pain: Secondary | ICD-10-CM

## 2017-07-15 MED ORDER — METHOCARBAMOL 500 MG PO TABS: ORAL_TABLET | ORAL | 1 refills | Status: DC

## 2017-07-15 MED ORDER — QUETIAPINE FUMARATE ER 150 MG PO TB24: 150.0000 mg | ORAL_TABLET | Freq: Every day | ORAL | 3 refills | Status: DC

## 2017-07-15 MED ORDER — NAPROXEN 500 MG PO TABS: 500.0000 mg | ORAL_TABLET | Freq: Two times a day (BID) | ORAL | 1 refills | Status: DC

## 2017-07-15 NOTE — Assessment & Plan Note (Signed)
Will continue current dose of celexa and increase seroquel. Encouraged her to see [facility]. Recheck 2 weeks.  

## 2017-07-15 NOTE — Progress Notes (Signed)
BP 117/78 (BP Location: Left Arm, Patient Position: Sitting, Cuff Size: Normal)   Pulse 80   Temp 98.7 F (37.1 C)   Wt 118 lb 2 oz (53.6 kg)   LMP 06/29/2017   SpO2 100%   BMI 20.28 kg/m    Subjective:    Patient ID: Jeanette Wilson, female    DOB: 1981-07-22, 36 y.o.   MRN: [id number]  HPI: Jeanette Wilson is a 36 y.o. female  Chief Complaint  Patient presents with  . Depression  . Back Pain   DEPRESSION- Went to [facility], but they were busy, so she wasn't able to see them Mood status: stable Satisfied with current treatment?: no Symptom severity: moderate  Duration of current treatment : 2 weeks ago Side effects: no Medication compliance: excellent compliance Psychotherapy/counseling: no in the past Previous psychiatric medications:  Depressed mood: yes Anxious mood: yes Anhedonia: no Significant weight loss or gain: no Insomnia: yes hard to fall asleep Fatigue: yes Feelings of worthlessness or guilt: yes Impaired concentration/indecisiveness: yes Suicidal ideations: yes Hopelessness: no Crying spells: yes [facility] 07/15/2017 06/30/2017 05/27/2017 05/20/2017 07/13/2015  Decreased Interest 2 3 0 0 2  Down, Depressed, Hopeless 3 3 3 3 3   PHQ - 2 Score 5 6 3 3 5   Altered sleeping 3 3 3 3 3   Tired, decreased energy 3 2 3 3 2   Change in appetite 3 2 1 1 3   Feeling bad or failure about yourself  2 2 3 3 3   Trouble concentrating 2 2 1 1 3   Moving slowly or fidgety/restless 1 2 0 0 3  Suicidal thoughts 1 2 0 1 3  PHQ-9 Score 20 21 14 15 25   Difficult doing work/chores Somewhat difficult Very difficult Somewhat difficult Not difficult at all -   BACK PAIN- no better with the muscle relaxers Duration: June Mechanism of injury: unknown Location: bilateral and low back Onset: gradual Severity: moderate Quality: dull and aching Frequency: intermittent Radiation: none Aggravating factors: working, standing, walking Alleviating factors:  heat Status: stable Treatments attempted: rest, heat, APAP and ibuprofen  Relief with NSAIDs?: mild Nighttime pain:  yes Paresthesias / decreased sensation:  no Bowel / bladder incontinence:  no Fevers:  no Dysuria / urinary frequency:  no   Relevant past medical, surgical, family and social history reviewed and updated as indicated. Interim medical history since our last visit reviewed. Allergies and medications reviewed and updated.  Review of Systems  Constitutional: Negative.   Respiratory: Negative.   Cardiovascular: Negative.   Musculoskeletal: Positive for back pain and myalgias. Negative for arthralgias, gait problem, joint swelling, neck pain and neck stiffness.  Psychiatric/Behavioral: Positive for dysphoric mood and sleep disturbance. Negative for agitation, behavioral problems, confusion, decreased concentration, hallucinations, self-injury and suicidal ideas. The patient is nervous/anxious. The patient is not hyperactive.     Per HPI unless specifically indicated above     Objective:    BP 117/78 (BP Location: Left Arm, Patient Position: Sitting, Cuff Size: Normal)   Pulse 80   Temp 98.7 F (37.1 C)   Wt 118 lb 2 oz (53.6 kg)   LMP 06/29/2017   SpO2 100%   BMI 20.28 kg/m   Wt Readings from Last 3 Encounters:  07/15/17 118 lb 2 oz (53.6 kg)  06/30/17 116 lb 9 oz (52.9 kg)  05/29/17 117 lb 2 oz (53.1 kg)    Physical Exam  Constitutional: She is oriented to person, place, and time. She appears well-developed  and well-nourished. No distress.  HENT:  Head: Normocephalic and atraumatic.  Right Ear: Hearing normal.  Left Ear: Hearing normal.  Nose: Nose normal.  Eyes: Conjunctivae and lids are normal. Right eye exhibits no discharge. Left eye exhibits no discharge. No scleral icterus.  Cardiovascular: Normal rate, regular rhythm, normal heart sounds and intact distal pulses. Exam reveals no gallop and no friction rub.  No murmur heard. Pulmonary/Chest: Effort  normal and breath sounds normal. No respiratory distress. She has no wheezes. She has no rales. She exhibits no tenderness.  Neurological: She is alert and oriented to person, place, and time.  Skin: Skin is warm, dry and intact. No rash noted. She is not diaphoretic. No erythema. No pallor.  Psychiatric: She has a normal mood and affect. Her speech is normal and behavior is normal. Judgment and thought content normal. Cognition and memory are normal.  Nursing note and vitals reviewed. Back Exam:    Inspection:  Normal spinal curvature.  No deformity, ecchymosis, erythema, or lesions     Palpation:     Midline spinal tenderness: no  Paralumbar tenderness: yes bilateral     Parathoracic tenderness: no      Buttocks tenderness: no     Range of Motion:      Flexion: Fingers to Knees     Extension:Decreased     Lateral bending:Decreased    Rotation:Decreased    Neuro Exam:Lower extremity DTRs normal & symmetric.  Strength and sensation intact.    Special Tests:      Straight leg raise:negative   Results for orders placed or performed in visit on 04/30/17  TSH  Result Value Ref Range   TSH 0.716 0.450 - 4.500 uIU/mL  Comprehensive metabolic panel  Result Value Ref Range   Glucose 85 65 - 99 mg/dL   BUN 15 6 - 20 mg/dL   Creatinine, Ser 4.090.93 0.57 - 1.00 mg/dL   GFR calc non Af Amer 80 >59 mL/min/1.73   GFR calc Af Amer 93 >59 mL/min/1.73   BUN/Creatinine Ratio 16 9 - 23   Sodium 138 134 - 144 mmol/L   Potassium 4.3 3.5 - 5.2 mmol/L   Chloride 102 96 - 106 mmol/L   CO2 20 20 - 29 mmol/L   Calcium 9.6 8.7 - 10.2 mg/dL   Total Protein 7.8 6.0 - 8.5 g/dL   Albumin 4.8 3.5 - 5.5 g/dL   Globulin, Total 3.0 1.5 - 4.5 g/dL   Albumin/Globulin Ratio 1.6 1.2 - 2.2   Bilirubin Total 0.8 0.0 - 1.2 mg/dL   Alkaline Phosphatase 35 (L) 39 - 117 IU/L   AST 13 0 - 40 IU/L   ALT 8 0 - 32 IU/L  CBC with Differential/Platelet  Result Value Ref Range   WBC 5.6 3.4 - 10.8 x10E3/uL   RBC 4.19  3.77 - 5.28 x10E6/uL   Hemoglobin 13.1 11.1 - 15.9 g/dL   Hematocrit 81.140.5 91.434.0 - 46.6 %   MCV 97 79 - 97 fL   MCH 31.3 26.6 - 33.0 pg   MCHC 32.3 31.5 - 35.7 g/dL   RDW 78.213.1 95.612.3 - 21.315.4 %   Platelets 299 150 - 379 x10E3/uL   Neutrophils 59 Not Estab. %   Lymphs 33 Not Estab. %   Monocytes 8 Not Estab. %   Eos 0 Not Estab. %   Basos 0 Not Estab. %   Neutrophils Absolute 3.3 1.4 - 7.0 x10E3/uL   Lymphocytes Absolute 1.9 0.7 - 3.1 x10E3/uL  Monocytes Absolute 0.5 0.1 - 0.9 x10E3/uL   EOS (ABSOLUTE) 0.0 0.0 - 0.4 x10E3/uL   Basophils Absolute 0.0 0.0 - 0.2 x10E3/uL   Immature Granulocytes 0 Not Estab. %   Immature Grans (Abs) 0.0 0.0 - 0.1 x10E3/uL  VITAMIN D 25 Hydroxy (Vit-D Deficiency, Fractures)  Result Value Ref Range   Vit D, 25-Hydroxy 17.7 (L) 30.0 - 100.0 ng/mL  Lipid Panel w/o Chol/HDL Ratio  Result Value Ref Range   Cholesterol, Total 185 100 - 199 mg/dL   Triglycerides 54 0 - 149 mg/dL   HDL 87 >16 mg/dL   VLDL Cholesterol Cal 11 5 - 40 mg/dL   LDL Calculated 87 0 - 99 mg/dL  Lyme Ab/Western Blot Reflex  Result Value Ref Range   Lyme IgG/IgM Ab <0.91 0.00 - 0.90 ISR   LYME DISEASE AB, QUANT, IGM <0.80 0.00 - 0.79 index  Rocky mtn spotted fvr abs pnl(IgG+IgM)  Result Value Ref Range   RMSF IgG Negative Negative   RMSF IgM 0.76 0.00 - 0.89 index  Ehrlichia Antibody Panel  Result Value Ref Range   E.Chaffeensis (HME) IgG Negative Neg:<1:64   E. Chaffeensis (HME) IgM Titer Negative Neg:<1:20   HGE IgG Titer Negative Neg:<1:64   HGE IgM Titer Negative Neg:<1:20  Babesia microti Antibody Panel  Result Value Ref Range   Babesia microti IgM <1:10 Neg:<1:10   Babesia microti IgG <1:10 Neg:<1:10      Assessment & Plan:   Problem List Items Addressed This Visit      Other   Major depressive disorder, recurrent, severe with psychotic features (HCC)    Will continue current dose of celexa and increase seroquel. Encouraged her to see [facility] center. Recheck 2 weeks.        Schizophrenia (HCC)    Will continue current dose of celexa and increase seroquel. Encouraged her to see [facility] center. Recheck 2 weeks.        Other Visit Diagnoses    Chronic bilateral low back pain without sciatica    -  Primary   Will obtain x-ray and continue naproxen and methocarbomal. Exercises given. Call with any concerns.    Relevant Medications   methocarbamol (ROBAXIN) 500 MG tablet   naproxen (NAPROSYN) 500 MG tablet   Other Relevant Orders   DG Lumbar Spine Complete       Follow up plan: Return in about 2 weeks (around 07/29/2017) for Follow up mood.

## 2017-07-15 NOTE — Patient Instructions (Addendum)

## 2017-07-15 NOTE — Assessment & Plan Note (Signed)
Will continue current dose of celexa and increase seroquel. Encour[facility] to [facility]. Recheck 2 weeks.

## 2017-07-16 DIAGNOSIS — M25612 Stiffness of left shoulder, not elsewhere classified: Secondary | ICD-10-CM | POA: Diagnosis not present

## 2017-07-16 DIAGNOSIS — M25512 Pain in left shoulder: Secondary | ICD-10-CM | POA: Diagnosis not present

## 2017-07-16 DIAGNOSIS — M6281 Muscle weakness (generalized): Secondary | ICD-10-CM | POA: Diagnosis not present

## 2017-08-05 ENCOUNTER — Ambulatory Visit: Payer: Medicare Other | Admitting: Family Medicine

## 2017-09-17 ENCOUNTER — Emergency Department (HOSPITAL_COMMUNITY)
Admission: EM | Admit: 2017-09-17 | Discharge: 2017-09-17 | Disposition: A | Discharge status: Home / Self Care | Payer: Medicare Other | Attending: Emergency Medicine | Admitting: Emergency Medicine

## 2017-09-17 ENCOUNTER — Encounter (HOSPITAL_COMMUNITY): Payer: Self-pay | Admitting: Emergency Medicine

## 2017-09-17 ENCOUNTER — Other Ambulatory Visit: Payer: Self-pay

## 2017-09-17 DIAGNOSIS — N644 Mastodynia: Secondary | ICD-10-CM

## 2017-09-17 DIAGNOSIS — F1210 Cannabis abuse, uncomplicated: Secondary | ICD-10-CM | POA: Diagnosis not present

## 2017-09-17 DIAGNOSIS — Z79899 Other long term (current) drug therapy: Secondary | ICD-10-CM | POA: Diagnosis not present

## 2017-09-17 DIAGNOSIS — Z87891 Personal history of nicotine dependence: Secondary | ICD-10-CM | POA: Diagnosis not present

## 2017-09-17 NOTE — ED Provider Notes (Signed)
[facility] Provider Note   CSN: [id number] Arrival date & time: 09/17/17  1929     History   Chief Complaint Chief Complaint  Patient presents with  . Breast Pain    HPI Jeanette Wilson is a 37 y.o. female presenting to the ED with 2 months of persistent left breast pain.  States breast pain initially began in bilateral breasts just before a menstrual period in November, however states the left breast continued to feel painful since that time.  Localizes pain diffusely over the breast, however is more severe underneath. Pain is worse when she is not wearing a bra.  Denies nipple discharge, redness, fever, lumps/masses.  Is not lactating.  No history or family history of breast cancer.  The history is provided by the patient.    Past Medical History:  Diagnosis Date  . Anxiety   . Depression   . Schizophrenia (HCC)   . UTI (urinary tract infection) tubiligation    Patient Active Problem List   Diagnosis Date Noted  . 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 Schizophrenia (HCC) 06/30/2015  . Left lower quadrant pain 06/30/2015  . 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 Generalized anxiety disorder 11/27/2011    Class: Acute    Past Surgical History:  Procedure Laterality Date  . TONSILLECTOMY    . TUBAL LIGATION    . WISDOM TOOTH EXTRACTION      OB History    Gravida Para Term Preterm AB Living   2 2 2  0 0     SAB TAB Ectopic Multiple Live Births   0 0 0           Home Medications    Prior to Admission medications   Medication Sig Start Date End Date Taking? Authorizing Provider  citalopram (CELEXA) 20 MG tablet Take 1 tablet (20 mg total) by mouth daily. 06/30/17   Olevia PerchesJohnson, Megan P, DO  methocarbamol (ROBAXIN) 500 MG tablet take 1 tablet by mouth every 8 hours if needed for muscle spasm 07/15/17   Laural BenesJohnson, Megan P, DO  naproxen (NAPROSYN) 500 MG tablet Take  1 tablet (500 mg total) 2 (two) times daily with a meal by mouth. 07/15/17   Johnson, Megan P, DO  QUEtiapine Fumarate (SEROQUEL XR) 150 MG 24 hr tablet Take 1 tablet (150 mg total) at bedtime by mouth. 07/15/17   Dorcas CarrowJohnson, Megan P, DO    Family History Family History  Problem Relation Age of Onset  . Cancer Mother   . Hypertension Mother   . Diabetes Mother   . Asthma Sister   . ADD / ADHD Brother   . Schizophrenia Brother   . Bipolar disorder Brother   . Hypertension Brother   . Cirrhosis Brother   . Asthma Daughter   . Diabetes Maternal Grandmother   . Hypertension Maternal Grandmother   . Diabetes Paternal Grandmother   . Heart disease Paternal Grandmother   . Heart disease Brother   . Anesthesia problems Neg Hx   . Hypotension Neg Hx   . Malignant hyperthermia Neg Hx   . Pseudochol deficiency Neg Hx     Social History Social History   Tobacco Use  . Smoking status: Former Smoker    Packs/day: 0.25    Last attempt to quit: 09/03/2015    Years since quitting: 2.0  . Smokeless tobacco: Never Used  Substance Use Topics  . Alcohol use:  Yes    Comment: social drinking  5th of liquor weekly  . Drug use: Yes    Frequency: 1.0 times per week    Types: Marijuana     Allergies   Wheat bran   Review of Systems Review of Systems  Constitutional: Negative for fever.  Musculoskeletal:       Breast pain  Skin: Negative for color change and wound.     Physical Exam Updated Vital Signs BP 116/73 (BP Location: Right Arm)   Temp 98.4 F (36.9 C) (Oral)   Resp 14   LMP 08/27/2017 (Exact Date)   SpO2 98%   Physical Exam  Constitutional: She appears well-developed and well-nourished. No distress.  HENT:  Head: Normocephalic and atraumatic.  Eyes: Conjunctivae are normal.  Cardiovascular: Normal rate and intact distal pulses.  Pulmonary/Chest: Effort normal. Right breast exhibits no inverted nipple, no mass, no nipple discharge and no skin change. Left breast  exhibits tenderness. Left breast exhibits no inverted nipple, no mass, no nipple discharge and no skin change. Breasts are symmetrical. There is no breast swelling.  Exam performed with female chaperone present. Left breast without erythema, warmth, masses, or discharge. TTP over lower portion of breast.     Psychiatric: She has a normal mood and affect. Her behavior is normal.  Nursing note and vitals reviewed.    ED Treatments / Results  Labs (all labs ordered are listed, but only abnormal results are displayed) Labs Reviewed  POC URINE PREG, ED    EKG  EKG Interpretation None       Radiology No results found.  Procedures Procedures (including critical care time)  Medications Ordered in ED Medications - No data to display   Initial Impression / Assessment and Plan / ED Course  I have reviewed the triage vital signs and the nursing notes.  Pertinent labs & imaging results that were available during my care of the patient were reviewed by me and considered in my medical decision making (see chart for details).     Pt with left breast pain x2 months.  Pt is not lactating. No personal or family hx of breast cancer. On exam, no nipple discharge, skin changes, or masses. No evidence of infection. Recommended symptomatic management, supportive undergarments, and OB/Gyn follow up for further evaluation. Well-appearing, safe for discharge.  Discussed results, findings, treatment and follow up. Patient advised of return precautions. Patient verbalized understanding and agreed with plan.   Final Clinical Impressions(s) / ED Diagnoses   Final diagnoses:  Breast pain    ED Discharge Orders    None       Karmela Bram, [place] N, PA-C 09/17/17 2259    Maia Plan, MD 09/18/17 [phone]

## 2017-09-17 NOTE — ED Triage Notes (Signed)
Patient with left breast pain that started a few months ago.  She states that it is in both sides but today it has gotten worse on the left.  She states that when she was taking a shower, the hot water made her breast sore and sensitive.

## 2017-09-17 NOTE — Discharge Instructions (Signed)
Please read instructions below. You can take advil/ibuprofen every 6 hours as needed for pain. Schedule an appointment with your OB/GYN or the women's clinic for further evaluation of your ongoing breast pain. Return to the ER for fever, redness, or new or concerning symptoms.

## 2017-11-10 ENCOUNTER — Other Ambulatory Visit: Payer: Self-pay

## 2017-11-10 ENCOUNTER — Emergency Department (HOSPITAL_COMMUNITY)
Admission: EM | Admit: 2017-11-10 | Discharge: 2017-11-10 | Disposition: A | Discharge status: Home / Self Care | Payer: Medicare Other | Attending: Emergency Medicine | Admitting: Emergency Medicine

## 2017-11-10 ENCOUNTER — Encounter (HOSPITAL_COMMUNITY): Payer: Self-pay

## 2017-11-10 DIAGNOSIS — J3489 Other specified disorders of nose and nasal sinuses: Secondary | ICD-10-CM | POA: Diagnosis present

## 2017-11-10 DIAGNOSIS — L0100 Impetigo, unspecified: Secondary | ICD-10-CM | POA: Diagnosis not present

## 2017-11-10 DIAGNOSIS — Z87891 Personal history of nicotine dependence: Secondary | ICD-10-CM | POA: Insufficient documentation

## 2017-11-10 DIAGNOSIS — Z79899 Other long term (current) drug therapy: Secondary | ICD-10-CM | POA: Diagnosis not present

## 2017-11-10 MED ORDER — CEPHALEXIN 500 MG PO CAPS: 500.0000 mg | ORAL_CAPSULE | Freq: Four times a day (QID) | ORAL | 0 refills | Status: DC

## 2017-11-10 NOTE — Discharge Instructions (Signed)
Please read attached information. If you experience any new or worsening signs or symptoms please return to the emergency room for evaluation. Please follow-up with your primary care provider or specialist as discussed. Please use medication prescribed only as directed and discontinue taking if you have any concerning signs or symptoms.   °

## 2017-11-10 NOTE — ED Provider Notes (Signed)
[facility] EMERGENCY DEPARTMENT Provider Note   CSN: [id number] Arrival date & time: 11/10/17  1427     History   Chief Complaint Chief Complaint  Patient presents with  . Facial Pain    HPI Jeanette Wilson is a 37 y.o. female.  HPI    37 year old female presents today with 2-week history of nose pain.  Patient reports that she developed a small sore on the inside portion of her nose approximate 2 weeks ago.  She notes this crust over and is painful.  She notes she has been using Neosporin for 2 weeks without significant improvement in her symptoms.  She reports pain and swelling of the nose, she denies any fever history of significant skin infections.  Involvement of the other side or mouth.     Past Medical History:  Diagnosis Date  . Anxiety   . Depression   . Schizophrenia (HCC)   . UTI (urinary tract infection) tubiligation    Patient Active Problem List   Diagnosis Date Noted  . 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 Schizophrenia (HCC) 06/30/2015  . Left lower quadrant pain 06/30/2015  . 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 Generalized anxiety disorder 11/27/2011    Class: Acute    Past Surgical History:  Procedure Laterality Date  . TONSILLECTOMY    . TUBAL LIGATION    . WISDOM TOOTH EXTRACTION      OB History    Gravida Para Term Preterm AB Living   2 2 2  0 0     SAB TAB Ectopic Multiple Live Births   0 0 0           Home Medications    Prior to Admission medications   Medication Sig Start Date End Date Taking? Authorizing Provider  cephALEXin (KEFLEX) 500 MG capsule Take 1 capsule (500 mg total) by mouth 4 (four) times daily. 11/10/17   Corrion Stirewalt, Tinnie Gens, PA-C  citalopram (CELEXA) 20 MG tablet Take 1 tablet (20 mg total) by mouth daily. 06/30/17   Olevia Perches P, DO  methocarbamol (ROBAXIN) 500 MG tablet take 1 tablet by mouth every 8 hours if needed for  muscle spasm 07/15/17   Laural Benes, Megan P, DO  naproxen (NAPROSYN) 500 MG tablet Take 1 tablet (500 mg total) 2 (two) times daily with a meal by mouth. 07/15/17   Johnson, Megan P, DO  QUEtiapine Fumarate (SEROQUEL XR) 150 MG 24 hr tablet Take 1 tablet (150 mg total) at bedtime by mouth. 07/15/17   Dorcas Carrow, DO    Family History Family History  Problem Relation Age of Onset  . Cancer Mother   . Hypertension Mother   . Diabetes Mother   . Asthma Sister   . ADD / ADHD Brother   . Schizophrenia Brother   . Bipolar disorder Brother   . Hypertension Brother   . Cirrhosis Brother   . Asthma Daughter   . Diabetes Maternal Grandmother   . Hypertension Maternal Grandmother   . Diabetes Paternal Grandmother   . Heart disease Paternal Grandmother   . Heart disease Brother   . Anesthesia problems Neg Hx   . Hypotension Neg Hx   . Malignant hyperthermia Neg Hx   . Pseudochol deficiency Neg Hx     Social History Social History   Tobacco Use  . Smoking status: Former Smoker    Packs/day: 0.25    Last attempt to quit:  09/03/2015    Years since quitting: 2.1  . Smokeless tobacco: Never Used  Substance Use Topics  . Alcohol use: Yes    Comment: social drinking  5th of liquor weekly  . Drug use: Yes    Frequency: 1.0 times per week    Types: Marijuana     Allergies   Wheat bran   Review of Systems Review of Systems   Physical Exam Updated Vital Signs BP (!) 124/92 (BP Location: Right Arm)   Pulse 87   Temp 98.6 F (37 C) (Oral)   Resp 18   LMP 10/20/2017   SpO2 100%   Physical Exam  Constitutional: She is oriented to person, place, and time. She appears well-developed and well-nourished.  HENT:  Head: Normocephalic and atraumatic.  External exam of the nose and face shows no swelling or edema, no redness or asymmetry small papule with crusting on the alar tissue right- no discharge, small amount of yellow crusting .  No septal abnormalities, no significant  swelling or erythema of the turbinates / left normal   Eyes: Conjunctivae are normal. Pupils are equal, round, and reactive to light. Right eye exhibits no discharge. Left eye exhibits no discharge. No scleral icterus.  Neck: Normal range of motion. No JVD present. No tracheal deviation present.  Pulmonary/Chest: Effort normal. No stridor.  Neurological: She is alert and oriented to person, place, and time. Coordination normal.  Psychiatric: She has a normal mood and affect. Her behavior is normal. Judgment and thought content normal.  Nursing note and vitals reviewed.    ED Treatments / Results  Labs (all labs ordered are listed, but only abnormal results are displayed) Labs Reviewed - No data to display  EKG  EKG Interpretation None       Radiology No results found.  Procedures Procedures (including critical care time)  Medications Ordered in ED Medications - No data to display   Initial Impression / Assessment and Plan / ED Course  I have reviewed the triage vital signs and the nursing notes.  Pertinent labs & imaging results that were available during my care of the patient were reviewed by me and considered in my medical decision making (see chart for details).      Final Clinical Impressions(s) / ED Diagnoses   Final diagnoses:  Impetigo    Labs:   Imaging:  Consults:  Therapeutics:  Discharge Meds: keflex  Assessment/Plan: 37 year old female presents today with likely impetigo.  Patient with a very small papule with crusting, no significant involvement, nares are patent no other ulcerations sores or abnormalities.  No signs of significant infection.  Patient notes that she has been using Neosporin without improvement in her symptoms.  Patient be started on Keflex, encouraged follow-up with ENT if symptoms persist return if they worsen.  She verbalized understanding and agreement to today's plan.  No signs of deep space involvement.  She is to continue  using Neosporin.    ED Discharge Orders        Ordered    cephALEXin (KEFLEX) 500 MG capsule  4 times daily     11/10/17 1650       Eyvonne MechanicHedges, Kirti Carl, Cordelia Poche-C 11/10/17 1651    Azalia Bilisampos, Kevin, MD 11/10/17 1731

## 2017-11-10 NOTE — ED Triage Notes (Signed)
Pt reports she has swelling and irritation to the inside of her nose. States she has had some bleeding when blowing her nose. Pt reports area feels irritated.

## 2017-12-23 ENCOUNTER — Encounter (HOSPITAL_COMMUNITY): Payer: Self-pay | Admitting: Emergency Medicine

## 2017-12-23 ENCOUNTER — Emergency Department (HOSPITAL_COMMUNITY)
Admission: EM | Admit: 2017-12-23 | Discharge: 2017-12-23 | Disposition: A | Discharge status: Home / Self Care | Payer: Medicare Other | Attending: Emergency Medicine | Admitting: Emergency Medicine

## 2017-12-23 DIAGNOSIS — J340 Abscess, furuncle and carbuncle of nose: Secondary | ICD-10-CM | POA: Insufficient documentation

## 2017-12-23 DIAGNOSIS — Z79899 Other long term (current) drug therapy: Secondary | ICD-10-CM | POA: Diagnosis not present

## 2017-12-23 DIAGNOSIS — Z87891 Personal history of nicotine dependence: Secondary | ICD-10-CM | POA: Diagnosis not present

## 2017-12-23 DIAGNOSIS — R51 Headache: Secondary | ICD-10-CM | POA: Diagnosis present

## 2017-12-23 DIAGNOSIS — L0291 Cutaneous abscess, unspecified: Secondary | ICD-10-CM

## 2017-12-23 MED ORDER — CEPHALEXIN 500 MG PO CAPS: 500.0000 mg | ORAL_CAPSULE | Freq: Four times a day (QID) | ORAL | 0 refills | Status: DC

## 2017-12-23 NOTE — ED Triage Notes (Signed)
Pt to ER for evaluation of left nostril bump and pain. Reports prev hx of staff infection to the right.

## 2017-12-23 NOTE — Discharge Instructions (Addendum)
Please read attached information. If you experience any new or worsening signs or symptoms please return to the emergency room for evaluation. Please follow-up with your primary care provider or specialist as discussed. Please use medication prescribed only as directed and discontinue taking if you have any concerning signs or symptoms.   °

## 2017-12-23 NOTE — ED Provider Notes (Signed)
[facility] EMERGENCY DEPARTMENT Provider Note   CSN: [id number] Arrival date & time: 12/23/17  1056     History   Chief Complaint Chief Complaint  Patient presents with  . Facial Pain    HPI CAROLENA FAIRBANK is a 36 y.o. female.  HPI   37 year old female presents today with complaints of facial pain.  Patient notes a abscess forming on her left nare.  She reports this is been going on for approximately 1 week, but has been using warm compress without improvement in her symptoms.  Patient denies any fever, reports minor facial pain around the nose with no significant facial swelling.  She notes a history of the same approximately 4 weeks ago for which she treated with Keflex which resolved her symptoms.  Patient denies any history of MRSA.  No allergies or viral URI symptoms.       Past Medical History:  Diagnosis Date  . Anxiety   . Depression   . Schizophrenia (HCC)   . UTI (urinary tract infection) tubiligation    Patient Active Problem List   Diagnosis Date Noted  . 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 Schizophrenia (HCC) 06/30/2015  . Left lower quadrant pain 06/30/2015  . 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 Generalized anxiety disorder 11/27/2011    Class: Acute    Past Surgical History:  Procedure Laterality Date  . TONSILLECTOMY    . TUBAL LIGATION    . WISDOM TOOTH EXTRACTION       OB History    Gravida  2   Para  2   Term  2   Preterm  0   AB  0   Living        SAB  0   TAB  0   Ectopic  0   Multiple      Live Births               Home Medications    Prior to Admission medications   Medication Sig Start Date End Date Taking? Authorizing Provider  cephALEXin (KEFLEX) 500 MG capsule Take 1 capsule (500 mg total) by mouth 4 (four) times daily. 12/23/17   Jaleigh Mccroskey, Tinnie Gens, PA-C  citalopram (CELEXA) 20 MG tablet Take 1 tablet (20 mg total) by  mouth daily. 06/30/17   Olevia Perches P, DO  methocarbamol (ROBAXIN) 500 MG tablet take 1 tablet by mouth every 8 hours if needed for muscle spasm 07/15/17   Laural Benes, Megan P, DO  naproxen (NAPROSYN) 500 MG tablet Take 1 tablet (500 mg total) 2 (two) times daily with a meal by mouth. 07/15/17   Johnson, Megan P, DO  QUEtiapine Fumarate (SEROQUEL XR) 150 MG 24 hr tablet Take 1 tablet (150 mg total) at bedtime by mouth. 07/15/17   Dorcas Carrow, DO    Family History Family History  Problem Relation Age of Onset  . Cancer Mother   . Hypertension Mother   . Diabetes Mother   . Asthma Sister   . ADD / ADHD Brother   . Schizophrenia Brother   . Bipolar disorder Brother   . Hypertension Brother   . Cirrhosis Brother   . Asthma Daughter   . Diabetes Maternal Grandmother   . Hypertension Maternal Grandmother   . Diabetes Paternal Grandmother   . Heart disease Paternal Grandmother   . Heart disease Brother   . Anesthesia problems Neg Hx   .  Hypotension Neg Hx   . Malignant hyperthermia Neg Hx   . Pseudochol deficiency Neg Hx     Social History Social History   Tobacco Use  . Smoking status: Former Smoker    Packs/day: 0.25    Last attempt to quit: 09/03/2015    Years since quitting: 2.3  . Smokeless tobacco: Never Used  Substance Use Topics  . Alcohol use: Yes    Comment: social drinking  5th of liquor weekly  . Drug use: Yes    Frequency: 1.0 times per week    Types: Marijuana     Allergies   Wheat bran   Review of Systems Review of Systems  All other systems reviewed and are negative.   Physical Exam Updated Vital Signs BP 125/85 (BP Location: Right Arm)   Pulse 79   Temp 98.7 F (37.1 C) (Oral)   Resp 20   LMP 12/14/2017   SpO2 100%   Physical Exam  Constitutional: She is oriented to person, place, and time. She appears well-developed and well-nourished.  HENT:  Head: Normocephalic and atraumatic.  Small pustule on the left nare, no drainage, small  0.5 cm nodule on the left lateral cartilage, no surrounding redness or swelling, face nontender to palpation-no purulence from the nose  Eyes: Pupils are equal, round, and reactive to light. Conjunctivae are normal. Right eye exhibits no discharge. Left eye exhibits no discharge. No scleral icterus.  Neck: Normal range of motion. No JVD present. No tracheal deviation present.  Pulmonary/Chest: Effort normal. No stridor.  Neurological: She is alert and oriented to person, place, and time. Coordination normal.  Psychiatric: She has a normal mood and affect. Her behavior is normal. Judgment and thought content normal.  Nursing note and vitals reviewed.    ED Treatments / Results  Labs (all labs ordered are listed, but only abnormal results are displayed) Labs Reviewed - No data to display  EKG None  Radiology No results found.  Procedures Procedures (including critical care time)  Medications Ordered in ED Medications - No data to display   Initial Impression / Assessment and Plan / ED Course  I have reviewed the triage vital signs and the nursing notes.  Pertinent labs & imaging results that were available during my care of the patient were reviewed by me and considered in my medical decision making (see chart for details).     Final Clinical Impressions(s) / ED Diagnoses   Final diagnoses:  Abscess    Labs:   Imaging:  Consults:  Therapeutics:  Discharge Meds: keflex  Assessment/Plan: Patient has several developing small abscesses to the left nose, none amenable to drainage at this time.  This appears somewhat similar to presentation at last visit which responded to warm compress and antibiotics.  Patient will be placed on Keflex again, she will use warm compress and return immediately with any new or worsening signs or symptoms.  Patient verbalized understanding and agreement to today's plan had no further questions or concerns at the time of  discharge.      ED Discharge Orders        Ordered    cephALEXin (KEFLEX) 500 MG capsule  4 times daily     12/23/17 1321       Eyvonne Mechanic, PA-C 12/23/17 1321    Little, Ambrose Finland, MD 12/23/17 1324

## 2018-06-26 ENCOUNTER — Other Ambulatory Visit: Payer: Self-pay

## 2018-06-26 ENCOUNTER — Encounter (HOSPITAL_COMMUNITY): Payer: Self-pay | Admitting: Emergency Medicine

## 2018-06-26 ENCOUNTER — Emergency Department (HOSPITAL_COMMUNITY)
Admission: EM | Admit: 2018-06-26 | Discharge: 2018-06-27 | Disposition: A | Discharge status: Home / Self Care | Payer: 59 | Attending: Emergency Medicine | Admitting: Emergency Medicine

## 2018-06-26 DIAGNOSIS — Z87891 Personal history of nicotine dependence: Secondary | ICD-10-CM | POA: Insufficient documentation

## 2018-06-26 DIAGNOSIS — N898 Other specified noninflammatory disorders of vagina: Secondary | ICD-10-CM | POA: Diagnosis not present

## 2018-06-26 DIAGNOSIS — N760 Acute vaginitis: Secondary | ICD-10-CM | POA: Insufficient documentation

## 2018-06-26 DIAGNOSIS — B9689 Other specified bacterial agents as the cause of diseases classified elsewhere: Secondary | ICD-10-CM | POA: Diagnosis not present

## 2018-06-26 DIAGNOSIS — F1210 Cannabis abuse, uncomplicated: Secondary | ICD-10-CM | POA: Insufficient documentation

## 2018-06-26 DIAGNOSIS — R531 Weakness: Secondary | ICD-10-CM | POA: Diagnosis not present

## 2018-06-26 DIAGNOSIS — R202 Paresthesia of skin: Secondary | ICD-10-CM

## 2018-06-26 DIAGNOSIS — Z79899 Other long term (current) drug therapy: Secondary | ICD-10-CM | POA: Insufficient documentation

## 2018-06-26 DIAGNOSIS — R200 Anesthesia of skin: Secondary | ICD-10-CM | POA: Diagnosis present

## 2018-06-26 LAB — BASIC METABOLIC PANEL
Anion gap: 12 (ref 5–15)
BUN: 10 mg/dL (ref 6–20)
CO2: 25 mmol/L (ref 22–32)
CREATININE: 0.83 mg/dL (ref 0.44–1.00)
Calcium: 9.6 mg/dL (ref 8.9–10.3)
Chloride: 102 mmol/L (ref 98–111)
Glucose, Bld: 85 mg/dL (ref 70–99)
POTASSIUM: 3.4 mmol/L — AB (ref 3.5–5.1)
SODIUM: 139 mmol/L (ref 135–145)

## 2018-06-26 LAB — CBC
HCT: 39.2 % (ref 36.0–46.0)
HEMOGLOBIN: 12.7 g/dL (ref 12.0–15.0)
MCH: 30.7 pg (ref 26.0–34.0)
MCHC: 32.4 g/dL (ref 30.0–36.0)
MCV: 94.7 fL (ref 80.0–100.0)
Platelets: 300 10*3/uL (ref 150–400)
RBC: 4.14 MIL/uL (ref 3.87–5.11)
RDW: 11.8 % (ref 11.5–15.5)
WBC: 6.9 10*3/uL (ref 4.0–10.5)
nRBC: 0 % (ref 0.0–0.2)

## 2018-06-26 LAB — I-STAT TROPONIN, ED: Troponin i, poc: 0 ng/mL (ref 0.00–0.08)

## 2018-06-26 LAB — I-STAT BETA HCG BLOOD, ED (MC, WL, AP ONLY)

## 2018-06-26 NOTE — ED Triage Notes (Addendum)
Pt reports numbness to R arm for approximately one month. Pt reports today she noticed her arm was weak and unable to hold a cup. Pt does not have drift to any extremity. Pt has weak grip strength to L hand. Pt also reports having L side CP today

## 2018-06-27 LAB — URINALYSIS, ROUTINE W REFLEX MICROSCOPIC
BILIRUBIN URINE: NEGATIVE
Glucose, UA: NEGATIVE mg/dL
Hgb urine dipstick: NEGATIVE
KETONES UR: NEGATIVE mg/dL
Nitrite: NEGATIVE
PROTEIN: NEGATIVE mg/dL
SPECIFIC GRAVITY, URINE: 1.024 (ref 1.005–1.030)
pH: 6 (ref 5.0–8.0)

## 2018-06-27 LAB — WET PREP, GENITAL
Sperm: NONE SEEN
Trich, Wet Prep: NONE SEEN
Yeast Wet Prep HPF POC: NONE SEEN

## 2018-06-27 LAB — RPR: RPR: NONREACTIVE

## 2018-06-27 LAB — HIV ANTIBODY (ROUTINE TESTING W REFLEX): HIV SCREEN 4TH GENERATION: NONREACTIVE

## 2018-06-27 MED ORDER — METRONIDAZOLE 500 MG PO TABS: 500.0000 mg | ORAL_TABLET | Freq: Two times a day (BID) | ORAL | 0 refills | Status: DC

## 2018-06-27 NOTE — ED Provider Notes (Signed)
[facility] EMERGENCY DEPARTMENT Provider Note   CSN: [id number] Arrival date & time: 06/26/18  1804     History   Chief Complaint Chief Complaint  Patient presents with  . Numbness    R arm    HPI Jeanette Wilson is a 37 y.o. female.  Patient is a 37 year old female who presents with multiple complaints.  She has a 1 month history of weakness in her left arm.  She states is intermittently tingly.  She denies any neck or shoulder pain.  She states in September she had a pinched nerve in her shoulder and had the left arm weakness.  She states that it resolved and started back again a few weeks ago.  Been fairly constant and unchanged.  She denies any injuries to the area.  No swelling of the arm.  She denies any numbness now.  She just feels like it is heavy.  She denies any speech deficits.  No vision changes.  No leg involvement.  She has not been taking anything at home for the symptoms.  Today she had sharp stabbing pain in her left chest which she felt like was a muscle spasm.  She denies any current chest pain.  No shortness of breath.  She also complains of "ovary pain".  She states she has had similar pains in the past.  She states it happens about every other day.  She denies any vaginal bleeding.  She does complain of some vaginal discharge.  No urinary symptoms.     Past Medical History:  Diagnosis Date  . Anxiety   . Depression   . Schizophrenia (HCC)   . UTI (urinary tract infection) tubiligation    Patient Active Problem List   Diagnosis Date Noted  . 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 Schizophrenia (HCC) 06/30/2015  . Left lower quadrant pain 06/30/2015  . 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 Generalized anxiety disorder 11/27/2011    Class: Acute    Past Surgical History:  Procedure Laterality Date  . TONSILLECTOMY    . TUBAL LIGATION    . WISDOM TOOTH EXTRACTION        OB History    Gravida  2   Para  2   Term  2   Preterm  0   AB  0   Living        SAB  0   TAB  0   Ectopic  0   Multiple      Live Births               Home Medications    Prior to Admission medications   Medication Sig Start Date End Date Taking? Authorizing Provider  cephALEXin (KEFLEX) 500 MG capsule Take 1 capsule (500 mg total) by mouth 4 (four) times daily. 12/23/17   Hedges, Tinnie Gens, PA-C  citalopram (CELEXA) 20 MG tablet Take 1 tablet (20 mg total) by mouth daily. 06/30/17   Olevia Perches P, DO  methocarbamol (ROBAXIN) 500 MG tablet take 1 tablet by mouth every 8 hours if needed for muscle spasm 07/15/17   Laural Benes, Megan P, DO  metroNIDAZOLE (FLAGYL) 500 MG tablet Take 1 tablet (500 mg total) by mouth 2 (two) times daily. One po bid x 7 days 06/27/18   Rolan Bucco, MD  naproxen (NAPROSYN) 500 MG tablet Take 1 tablet (500 mg total) 2 (two) times daily with a meal by mouth.  07/15/17   Olevia Perches P, DO  QUEtiapine Fumarate (SEROQUEL XR) 150 MG 24 hr tablet Take 1 tablet (150 mg total) at bedtime by mouth. 07/15/17   Dorcas Carrow, DO    Family History Family History  Problem Relation Age of Onset  . Cancer Mother   . Hypertension Mother   . Diabetes Mother   . Asthma Sister   . ADD / ADHD Brother   . Schizophrenia Brother   . Bipolar disorder Brother   . Hypertension Brother   . Cirrhosis Brother   . Asthma Daughter   . Diabetes Maternal Grandmother   . Hypertension Maternal Grandmother   . Diabetes Paternal Grandmother   . Heart disease Paternal Grandmother   . Heart disease Brother   . Anesthesia problems Neg Hx   . Hypotension Neg Hx   . Malignant hyperthermia Neg Hx   . Pseudochol deficiency Neg Hx     Social History Social History   Tobacco Use  . Smoking status: Former Smoker    Packs/day: 0.25    Last attempt to quit: 09/03/2015    Years since quitting: 2.8  . Smokeless tobacco: Never Used  Substance Use Topics  .  Alcohol use: Yes    Comment: social drinking  5th of liquor weekly  . Drug use: Yes    Frequency: 1.0 times per week    Types: Marijuana     Allergies   Wheat bran   Review of Systems Review of Systems  Constitutional: Negative for chills, diaphoresis, fatigue and fever.  HENT: Negative for congestion, rhinorrhea and sneezing.   Eyes: Negative.   Respiratory: Negative for cough, chest tightness and shortness of breath.   Cardiovascular: Negative for chest pain and leg swelling.  Gastrointestinal: Negative for abdominal pain, blood in stool, diarrhea, nausea and vomiting.  Genitourinary: Positive for pelvic pain and vaginal discharge. Negative for difficulty urinating, flank pain, frequency, hematuria and vaginal bleeding.  Musculoskeletal: Negative for arthralgias and back pain.  Skin: Negative for rash.  Neurological: Positive for weakness and numbness. Negative for dizziness, speech difficulty and headaches.     Physical Exam Updated Vital Signs BP 128/84   Pulse 80   Temp 98.4 F (36.9 C) (Oral)   Resp 18   SpO2 100%   Physical Exam  Constitutional: She is oriented to person, place, and time. She appears well-developed and well-nourished.  HENT:  Head: Normocephalic and atraumatic.  Eyes: Pupils are equal, round, and reactive to light.  Neck: Normal range of motion. Neck supple.  Cardiovascular: Normal rate, regular rhythm and normal heart sounds.  Pulmonary/Chest: Effort normal and breath sounds normal. No respiratory distress. She has no wheezes. She has no rales. She exhibits no tenderness.  Abdominal: Soft. Bowel sounds are normal. There is no tenderness. There is no rebound and no guarding.  Musculoskeletal: Normal range of motion. She exhibits no edema.  No pain on palpation of the neck or shoulder.  She has full range of motion of all joints.  Radial pulses are intact bilaterally.  There is no swelling of the arms.  Lymphadenopathy:    She has no cervical  adenopathy.  Neurological: She is alert and oriented to person, place, and time.  She has normal sensation in all nerve distributions of the arm.  She has normal motor function.  No pronator drift.  She has some weakness in arm flexion of the elbow but it is an inconsistent exam.  She has normal motor strength and sensation  lower extremities.  No cranial nerve deficits.  Skin: Skin is warm and dry. No rash noted.  Psychiatric: She has a normal mood and affect.     ED Treatments / Results  Labs (all labs ordered are listed, but only abnormal results are displayed) Labs Reviewed  WET PREP, GENITAL - Abnormal; Notable for the following components:      Result Value   Clue Cells Wet Prep HPF POC PRESENT (*)    WBC, Wet Prep HPF POC MANY (*)    All other components within normal limits  BASIC METABOLIC PANEL - Abnormal; Notable for the following components:   Potassium 3.4 (*)    All other components within normal limits  URINALYSIS, ROUTINE W REFLEX MICROSCOPIC - Abnormal; Notable for the following components:   APPearance CLOUDY (*)    Leukocytes, UA LARGE (*)    Bacteria, UA FEW (*)    All other components within normal limits  CBC  RPR  HIV ANTIBODY (ROUTINE TESTING W REFLEX)  I-STAT BETA HCG BLOOD, ED ([facility], [facility], [facility] ONLY)  I-STAT TROPONIN, ED  GC/CHLAMYDIA PROBE AMP ([place]) NOT AT [facility]    EKG EKG Interpretation  Date/Time:  Friday June 26 2018 18:30:12 EDT Ventricular Rate:  76 PR Interval:  130 QRS Duration: 76 QT Interval:  344 QTC Calculation: 387 R Axis:   65 Text Interpretation:  Normal sinus rhythm Normal ECG since last tracing no significant change Confirmed by Rolan Bucco [phone]) on 06/27/2018 1:33:17 AM   Radiology No results found.  Procedures Procedures (including critical care time)  Medications Ordered in ED Medications - No data to display   Initial Impression / Assessment and Plan / ED Course  I have reviewed the triage vital signs and  the nursing notes.  Pertinent labs & imaging results that were available during my care of the patient were reviewed by me and considered in my medical decision making (see chart for details).     Patient is a 37 year old female who presents with multiple complaints.  She primarily complains of tingling and weakness in her left arm.  I do not appreciate any significant weakness.  She currently denies any numbness.  She does not have any palpable tenderness to her neck or shoulder.  However I do suspect that she could have a pinched nerve causing her symptoms.  She has no swelling.  No signs of infection.  No pulse deficit in the arm.  No other strokelike symptoms.  She states she had the symptoms previously and it resolved and then came back again.  I encouraged her to follow-up with the PCP.  She was given outpatient resources.  She was also given information about follow-up with a neurologist if her symptoms persist.  She also complains of vaginal discharge.  Her abdominal exam is benign.  Her pelvic exam shows some thick white discharge.  There is positive clue cells.  I will treat her with Flagyl.  Her pregnancy test is negative.  She had a fleeting episode of chest pain.  She does have some reproducible tenderness to her left upper chest wall.  Her EKG does not show any ischemic changes.  She does not have any other symptoms that would be more concerning for ACS.  She was discharged home in good condition.  Return precautions were given.  Final Clinical Impressions(s) / ED Diagnoses   Final diagnoses:  Paresthesia  BV (bacterial vaginosis)    ED Discharge Orders  Ordered    metroNIDAZOLE (FLAGYL) 500 MG tablet  2 times daily     06/27/18 0324           Rolan Bucco, MD 06/27/18 0327

## 2018-06-30 LAB — GC/CHLAMYDIA PROBE AMP (~~LOC~~) NOT AT ARMC
Chlamydia: NEGATIVE
Neisseria Gonorrhea: NEGATIVE

## 2018-07-06 ENCOUNTER — Encounter: Payer: Self-pay | Admitting: Neurology

## 2018-07-06 ENCOUNTER — Ambulatory Visit (INDEPENDENT_AMBULATORY_CARE_PROVIDER_SITE_OTHER): Payer: 59 | Admitting: Neurology

## 2018-07-06 VITALS — BP 110/78 | HR 70 | Ht 62.0 in | Wt 134.2 lb

## 2018-07-06 DIAGNOSIS — G8929 Other chronic pain: Secondary | ICD-10-CM | POA: Diagnosis not present

## 2018-07-06 DIAGNOSIS — R202 Paresthesia of skin: Secondary | ICD-10-CM | POA: Diagnosis not present

## 2018-07-06 DIAGNOSIS — M25512 Pain in left shoulder: Secondary | ICD-10-CM

## 2018-07-06 NOTE — Patient Instructions (Signed)
NCS/EMG of the left arm  ELECTROMYOGRAM AND NERVE CONDUCTION STUDIES (EMG/NCS) INSTRUCTIONS  How to Prepare The neurologist conducting the EMG will need to know if you have certain medical conditions. Tell the neurologist and other EMG lab personnel if you: . Have a pacemaker or any other electrical medical device . Take blood-thinning medications . Have hemophilia, a blood-clotting disorder that causes prolonged bleeding Bathing Take a shower or bath shortly before your exam in order to remove oils from your skin. Don't apply lotions or creams before the exam.  What to Expect You'll likely be asked to change into a hospital gown for the procedure and lie down on an examination table. The following explanations can help you understand what will happen during the exam.  . Electrodes. The neurologist or a technician places surface electrodes at various locations on your skin depending on where you're experiencing symptoms. Or the neurologist may insert needle electrodes at different sites depending on your symptoms.  . Sensations. The electrodes will at times transmit a tiny electrical current that you may feel as a twinge or spasm. The needle electrode may cause discomfort or pain that usually ends shortly after the needle is removed. If you are concerned about discomfort or pain, you may want to talk to the neurologist about taking a short break during the exam.  . Instructions. During the needle EMG, the neurologist will assess whether there is any spontaneous electrical activity when the muscle is at rest - activity that isn't present in healthy muscle tissue - and the degree of activity when you slightly contract the muscle.  He or she will give you instructions on resting and contracting a muscle at appropriate times. Depending on what muscles and nerves the neurologist is examining, he or she may ask you to change positions during the exam.  After your EMG You may experience some temporary,  minor bruising where the needle electrode was inserted into your muscle. This bruising should fade within several days. If it persists, contact your primary care doctor.    
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
## 2018-07-12 ENCOUNTER — Other Ambulatory Visit: Payer: Self-pay

## 2018-07-12 ENCOUNTER — Encounter (HOSPITAL_COMMUNITY): Payer: Self-pay | Admitting: *Deleted

## 2018-07-12 ENCOUNTER — Ambulatory Visit (HOSPITAL_COMMUNITY)
Admission: EM | Admit: 2018-07-12 | Discharge: 2018-07-12 | Disposition: A | Discharge status: Home / Self Care | Payer: 59 | Attending: Family Medicine | Admitting: Family Medicine

## 2018-07-12 DIAGNOSIS — L500 Allergic urticaria: Secondary | ICD-10-CM

## 2018-07-12 MED ORDER — PREDNISONE 10 MG (21) PO TBPK: ORAL_TABLET | ORAL | 0 refills | Status: DC

## 2018-07-12 MED ORDER — METHYLPREDNISOLONE SODIUM SUCC 125 MG IJ SOLR
80.0000 mg | Freq: Once | INTRAMUSCULAR | Status: AC
  Administered 2018-07-12: 80 mg via INTRAMUSCULAR

## 2018-07-12 MED ORDER — METHYLPREDNISOLONE SODIUM SUCC 125 MG IJ SOLR
INTRAMUSCULAR | Status: AC
  Filled 2018-07-12: qty 2

## 2018-07-12 NOTE — Discharge Instructions (Signed)
In am not sure what you are having a reaction too We will treat with steroid injection in the clinic and then send you home with steroid pack. Benadryl or zyrtec can help with the itching.  If you are not better in the next week or worse let us know You may want to follow up with allergist.

## 2018-07-12 NOTE — ED Provider Notes (Signed)
[facility]    CSN: [id number] Arrival date & time: 07/12/18  1324     History   Chief Complaint Chief Complaint  Patient presents with  . Urticaria    HPI Jeanette Wilson is a 37 y.o. female.   Pt  is a 37 year old female that presents today with urticaria with  pruritus from the waist down.  This problem started yesterday and has gotten worse and spreading more since yesterday.  She has been using Benadryl cream with no relief of symptoms.  She denies any history of previous reactions.  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 Denies any fever, joint pain. Denies any recent changes in lotions, detergents, foods or other possible irritants. No recent travel. Nobody else at home has the rash. Patient has been outside but denies any contact with plants or insects. No new foods or medications.   ROS per HPI       Past Medical History:  Diagnosis Date  . Anxiety   . Depression   . Schizophrenia (HCC)   . UTI (urinary tract infection) tubiligation    Patient Active Problem List   Diagnosis Date Noted  . 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 Schizophrenia (HCC) 06/30/2015  . Left lower quadrant pain 06/30/2015  . 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 Generalized anxiety disorder 11/27/2011    Class: Acute    Past Surgical History:  Procedure Laterality Date  . TONSILLECTOMY    . TUBAL LIGATION    . WISDOM TOOTH EXTRACTION      OB History    Gravida  2   Para  2   Term  2   Preterm  0   AB  0   Living        SAB  0   TAB  0   Ectopic  0   Multiple      Live Births               Home Medications    Prior to Admission medications   Medication Sig Start Date End Date Taking? Authorizing Provider  metroNIDAZOLE (FLAGYL) 500 MG tablet Take 1 tablet (500 mg total) by mouth 2 (two) times daily. One po bid x 7  days 06/27/18  Yes Rolan Bucco, MD  predniSONE (STERAPRED UNI-PAK 21 TAB) 10 MG (21) TBPK tablet 6 tabs for 1 day, then 5 tabs for 1 das, then 4 tabs for 1 day, then 3 tabs for 1 day, 2 tabs for 1 day, then 1 tab for 1 day 07/12/18   Janace Aris, NP    Family History Family History  Problem Relation Age of Onset  . Cancer Mother   . Hypertension Mother   . Diabetes Mother   . Asthma Sister   . ADD / ADHD Brother   . Schizophrenia Brother   . Bipolar disorder Brother   . Hypertension Brother   . Cirrhosis Brother   . Asthma Daughter   . Diabetes Maternal Grandmother   . Hypertension Maternal Grandmother   . Diabetes Paternal Grandmother   . Heart disease Paternal Grandmother   . Heart disease Brother   . Anesthesia problems Neg Hx   . Hypotension Neg Hx   . Malignant hyperthermia Neg Hx   . Pseudochol deficiency Neg Hx     Social History Social History   Tobacco  Use  . Smoking status: Former Smoker    Packs/day: 0.25    Last attempt to quit: 09/03/2015    Years since quitting: 2.8  . Smokeless tobacco: Never Used  Substance Use Topics  . Alcohol use: Yes    Comment: socially  . Drug use: Yes    Frequency: 1.0 times per week    Types: Marijuana     Allergies   Wheat bran   Review of Systems Review of Systems   Physical Exam Triage Vital Signs ED Triage Vitals  Enc Vitals Group     BP 07/12/18 1402 109/74     Pulse Rate 07/12/18 1402 89     Resp 07/12/18 1402 16     Temp 07/12/18 1402 98.3 F (36.8 C)     Temp Source 07/12/18 1402 Oral     SpO2 07/12/18 1402 100 %     Weight --      Height --      Head Circumference --      Peak Flow --      Pain Score 07/12/18 1403 8     Pain Loc --      Pain Edu? --      Excl. in GC? --    No data found.  Updated Vital Signs BP 109/74   Pulse 89   Temp 98.3 F (36.8 C) (Oral)   Resp 16   LMP 07/09/2018 (Exact Date)   SpO2 100%   Visual Acuity Right Eye Distance:   Left Eye Distance:   Bilateral  Distance:    Right Eye Near:   Left Eye Near:    Bilateral Near:     Physical Exam  Constitutional: She appears well-developed and well-nourished.  HENT:  Head: Normocephalic and atraumatic.  Eyes: Conjunctivae are normal.  Neck: Normal range of motion.  Pulmonary/Chest: Effort normal.  Musculoskeletal: Normal range of motion. She exhibits edema and tenderness.  Mild swelling and erythema to the head of the left 4th metacarpal. Able to flex and extend the digit. No increase in warmth.   Neurological: She is alert.  Skin: Skin is warm. Rash noted.  Widespread urticaria from the waist down  Psychiatric: She has a normal mood and affect.  Nursing note and vitals reviewed.    UC Treatments / Results  Labs (all labs ordered are listed, but only abnormal results are displayed) Labs Reviewed - No data to display  EKG None  Radiology No results found.  Procedures Procedures (including critical care time)  Medications Ordered in UC Medications  methylPREDNISolone sodium succinate (SOLU-MEDROL) 125 mg/2 mL injection 80 mg (80 mg Intramuscular Given 07/12/18 1436)    Initial Impression / Assessment and Plan / UC Course  I have reviewed the triage vital signs and the nursing notes.  Pertinent labs & imaging results that were available during my care of the patient were reviewed by me and considered in my medical decision making (see chart for details).    Urticaria-  Unsure of the source of allergic reaction Will treat the urticaria with steroid injection and pred pack.  Benadryl and zyrtec for itching.  Follow up as needed for continued or worsening symptoms  Final Clinical Impressions(s) / UC Diagnoses   Final diagnoses:  Allergic urticaria     Discharge Instructions     In am not sure what you are having a reaction too We will treat with steroid injection in the clinic and then send you home with steroid pack. Benadryl or  zyrtec can help with the itching.  If  you are not better in the next week or worse let us know You may want to follow up with allergist.     ED Prescriptions    Medication Sig Dispense Auth. Provider   predniSONE (STERAPRED UNI-PAK 21 TAB) 10 MG (21) TBPK tablet 6 tabs for 1 day, then 5 tabs for 1 das, then 4 tabs for 1 day, then 3 tabs for 1 day, 2 tabs for 1 day, then 1 tab for 1 day 21 tablet Jaci Lazier, Denyse Fillion A, NP     Controlled Substance Prescriptions [place] Controlled Substance Registry consulted? Not Applicable   Janace Aris, NP 07/12/18 1445

## 2018-07-12 NOTE — ED Triage Notes (Signed)
C/O hives to lower half of body and left shoulder since yesterday.  Has applied Benadryl gel without significant relief.  Also c/o left index finger and left hand pain that started before hives yesterday; denies any injuries.

## 2018-08-06 ENCOUNTER — Ambulatory Visit (INDEPENDENT_AMBULATORY_CARE_PROVIDER_SITE_OTHER): Payer: Self-pay | Admitting: Neurology

## 2018-08-06 DIAGNOSIS — R202 Paresthesia of skin: Secondary | ICD-10-CM

## 2018-08-06 NOTE — Procedures (Signed)
[facility]  [address], [address]  [place], [place] [address] Tel: [phone] Fax:  [phone] Test Date:  08/06/2018  Patient: Jeanette FosterSonyele Wilson DOB: 1981-01-16 Physician: Nita Sickleonika Patel, DO  Sex: Female Height: 5\' 2"  Ref Phys: Nita Sickleonika Patel, DO  ID#: [id number] Temp: 33.0C Technician:    Patient Complaints: This is a 37 year old female referred for evaluation of left shoulder pain and intermittent hand paresthesias.  NCV & EMG Findings: Extensive electrodiagnostic testing of the left upper extremity shows:  1. Left median, ulnar, and mixed palmar sensory responses are within normal limits. 2. Left median and ulnar motor responses are within normal limits. 3. There is no evidence of active or chronic motor axonal loss changes affecting any of the tested muscles.  Motor unit configuration and recruitment pattern is within normal limits.  Impression: This is a normal study of the left upper extremity.  In particular, there is no evidence of carpal tunnel syndrome or cervical radiculopathy.   ___________________________ Nita Sickleonika Patel, DO    Nerve Conduction Studies Anti Sensory Summary Table   Site NR Peak (ms) Norm Peak (ms) P-T Amp (V) Norm P-T Amp  Left Median Anti Sensory (2nd Digit)  33C  Wrist    2.9 <3.4 68.6 >20  Left Ulnar Anti Sensory (5th Digit)  33C  Wrist    2.7 <3.1 61.3 >12   Motor Summary Table   Site NR Onset (ms) Norm Onset (ms) O-P Amp (mV) Norm O-P Amp Site1 Site2 Delta-0 (ms) Dist (cm) Vel (m/s) Norm Vel (m/s)  Left Median Motor (Abd Poll Brev)  33C  Wrist    2.7 <3.9 15.1 >6 Elbow Wrist 4.4 28.0 64 >50  Elbow    7.1  14.3         Left Ulnar Motor (Abd Dig Minimi)  33C  Wrist    2.4 <3.1 11.0 >7 B Elbow Wrist 3.7 24.0 65 >50  B Elbow    6.1  11.0  A Elbow B Elbow 1.4 10.0 71 >50  A Elbow    7.5  10.4          Comparison Summary Table   Site NR Peak (ms) Norm Peak (ms) P-T Amp (V) Site1 Site2 Delta-P (ms) Norm Delta (ms)  Left  Median/Ulnar Palm Comparison (Wrist - 8cm)  33C  Median Palm    1.6 <2.2 62.9 Median Palm Ulnar Palm 0.2   Ulnar Palm    1.4 <2.2 23.2       EMG   Side Muscle Ins Act Fibs Psw Fasc Number Recrt Dur Dur. Amp Amp. Poly Poly. Comment  Left 1stDorInt Nml Nml Nml Nml Nml Nml Nml Nml Nml Nml Nml Nml N/A  Left PronatorTeres Nml Nml Nml Nml Nml Nml Nml Nml Nml Nml Nml Nml N/A  Left Biceps Nml Nml Nml Nml Nml Nml Nml Nml Nml Nml Nml Nml N/A  Left Triceps Nml Nml Nml Nml Nml Nml Nml Nml Nml Nml Nml Nml N/A  Left Deltoid Nml Nml Nml Nml Nml Nml Nml Nml Nml Nml Nml Nml N/A  Left Infraspinatus Nml Nml Nml Nml Nml Nml Nml Nml Nml Nml Nml Nml N/A      Waveforms:
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
## 2019-03-22 ENCOUNTER — Encounter (HOSPITAL_BASED_OUTPATIENT_CLINIC_OR_DEPARTMENT_OTHER): Payer: Self-pay | Admitting: *Deleted

## 2019-03-22 ENCOUNTER — Emergency Department (HOSPITAL_BASED_OUTPATIENT_CLINIC_OR_DEPARTMENT_OTHER)
Admission: EM | Admit: 2019-03-22 | Discharge: 2019-03-22 | Discharge status: Against Medical Advice | Payer: Medicare Other | Attending: Emergency Medicine | Admitting: Emergency Medicine

## 2019-03-22 ENCOUNTER — Other Ambulatory Visit: Payer: Self-pay

## 2019-03-22 DIAGNOSIS — Z5321 Procedure and treatment not carried out due to patient leaving prior to being seen by health care provider: Secondary | ICD-10-CM | POA: Diagnosis not present

## 2019-03-22 DIAGNOSIS — G43909 Migraine, unspecified, not intractable, without status migrainosus: Secondary | ICD-10-CM | POA: Diagnosis present

## 2019-03-22 DIAGNOSIS — F410 Panic disorder [episodic paroxysmal anxiety] without agoraphobia: Secondary | ICD-10-CM | POA: Insufficient documentation

## 2019-03-22 NOTE — ED Triage Notes (Signed)
c/o migraine which triggered her panic attack hx of both

## 2019-03-24 ENCOUNTER — Other Ambulatory Visit: Payer: Self-pay

## 2019-03-24 ENCOUNTER — Emergency Department
Admission: EM | Admit: 2019-03-24 | Discharge: 2019-03-24 | Disposition: A | Discharge status: Home / Self Care | Payer: Medicare Other | Attending: Emergency Medicine | Admitting: Emergency Medicine

## 2019-03-24 DIAGNOSIS — R51 Headache: Secondary | ICD-10-CM | POA: Insufficient documentation

## 2019-03-24 DIAGNOSIS — R519 Headache, unspecified: Secondary | ICD-10-CM

## 2019-03-24 DIAGNOSIS — Z87891 Personal history of nicotine dependence: Secondary | ICD-10-CM | POA: Insufficient documentation

## 2019-03-24 NOTE — ED Provider Notes (Signed)
[facility] Emergency Department Provider Note ____________________________________________  Time seen: 1207  I have reviewed the triage vital signs and the nursing notes.  HISTORY  Chief Complaint  Headache  HPI Jeanette AxonSonyele L Wilson is a 38 y.o. female presents herself to the ED for evaluation of a mild headache.  Patient reports that she had initial onset of her headache 2 days prior, while at work.  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  She describes experiencing a right-sided headache in her temples that she described as throbbing and pressure-like.  She denies any visual disturbance, syncope, nausea, vomiting, dizziness.  She does admit that on that particular day she was working at her job, and apparently got overheated.  She apparently walked to her car, that was parked in the parking lot, and sat in a car for several minutes.  She does note that the car was hot and she attempted to use an air conditioner which was not work at the time.  Her supervisors were made aware that she was feeling ill, and called EMS to transport her to the ED.  She declined ED transport at that time, and was later driven to the ED by her supervisors.  She was not evaluated in the ED secondary to the protracted wait, and left without being seen.  She took Tylenol and ibuprofen for headache pain, and went home and essentially slept at all.  She reports the headache was familiar and that she typically gets a right-sided headache with her premenstrual syndrome.  She also admits to some anxiety related to EMS being called for her.  She presents now denying any significant headache syndrome, nausea, vomiting, or dizziness.  She does admit that her period started yesterday, which is consistent with her headache pattern.  She denies any other high risk exposure, recent travel, or concerns over COVID-19.  She reports overall feeling improved from onset  2 days ago, and has been eating and drinking as expected.  Patient presented today at the urging of her mother, for evaluation.  Past Medical History:  Diagnosis Date  . Anxiety   . Depression   . Schizophrenia (HCC)   . UTI (urinary tract infection) tubiligation    Patient Active Problem List   Diagnosis Date Noted  . 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 Schizophrenia (HCC) 06/30/2015  . Left lower quadrant pain 06/30/2015  . 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 Generalized anxiety disorder 11/27/2011    Class: Acute    Past Surgical History:  Procedure Laterality Date  . TONSILLECTOMY    . TUBAL LIGATION    . WISDOM TOOTH EXTRACTION      Prior to Admission medications   Medication Sig Start Date End Date Taking? Authorizing Provider  acetaminophen (TYLENOL) 500 MG tablet Take 1,000 mg by mouth every 6 (six) hours as needed.    [provider]    Allergies Wheat bran  Family History  Problem Relation Age of Onset  . Cancer Mother   . Hypertension Mother   . Diabetes Mother   . Asthma Sister   . ADD / ADHD Brother   . Schizophrenia Brother   . Bipolar disorder Brother   . Hypertension Brother   . Cirrhosis Brother   . Asthma Daughter   . Diabetes Maternal Grandmother   . Hypertension Maternal Grandmother   . Diabetes Paternal Grandmother   .  Heart disease Paternal Grandmother   . Heart disease Brother   . Anesthesia problems Neg Hx   . Hypotension Neg Hx   . Malignant hyperthermia Neg Hx   . Pseudochol deficiency Neg Hx     Social History Social History   Tobacco Use  . Smoking status: Former Smoker    Packs/day: 0.25    Quit date: 09/03/2015    Years since quitting: 3.5  . Smokeless tobacco: Never Used  Substance Use Topics  . Alcohol use: Yes    Comment: socially  . Drug use: Yes    Frequency: 1.0 times per week    Types: Marijuana    Review of  Systems  Constitutional: Negative for fever. Eyes: Negative for visual changes. ENT: Negative for sore throat. Cardiovascular: Negative for chest pain. Respiratory: Negative for shortness of breath. Gastrointestinal: Negative for abdominal pain, vomiting and diarrhea. Genitourinary: Negative for dysuria. Musculoskeletal: Negative for back pain. Skin: Negative for rash. Neurological: Negative for focal weakness or numbness.  Reports headache as above. ____________________________________________  PHYSICAL EXAM:  VITAL SIGNS: ED Triage Vitals  Enc Vitals Group     BP 03/24/19 1024 135/81     Pulse Rate 03/24/19 1024 71     Resp 03/24/19 1024 17     Temp 03/24/19 1024 98.3 F (36.8 C)     Temp Source 03/24/19 1024 Oral     SpO2 03/24/19 1024 100 %     Weight 03/24/19 1025 130 lb (59 kg)     Height 03/24/19 1025 5\' 3"  (1.6 m)     Head Circumference --      Peak Flow --      Pain Score 03/24/19 1025 6     Pain Loc --      Pain Edu? --      Excl. in [place]? --     Constitutional: Alert and oriented. Well appearing and in no distress.  GCS =15 Head: Normocephalic and atraumatic. Eyes: Conjunctivae are normal. PERRL. Normal extraocular movements and fundi bilaterally Ears: Canals clear. TMs intact bilaterally. Nose: No congestion/rhinorrhea/epistaxis. Mouth/Throat: Mucous membranes are moist.  No oral lesions noted. Neck: Supple. No thyromegaly. Hematological/Lymphatic/Immunological: No cervical lymphadenopathy. Cardiovascular: Normal rate, regular rhythm. Normal distal pulses. Respiratory: Normal respiratory effort. No wheezes/rales/rhonchi. Gastrointestinal: Soft and nontender. No distention. Musculoskeletal: Nontender with normal range of motion in all extremities.  Neurologic:  Normal gait without ataxia. Normal speech and language. No gross focal neurologic deficits are appreciated. Skin:  Skin is warm, dry and intact. No rash noted. Psychiatric: Mood and affect are normal.  Patient exhibits appropriate insight and judgment. ____________________________________________   LABS (pertinent positives/negatives)  ____________________________________________  PROCEDURES  Procedures ____________________________________________  INITIAL IMPRESSION / ASSESSMENT AND PLAN / ED COURSE  Tanda Rockers was evaluated in Emergency Department on 03/24/2019 for the symptoms described in the history of present illness. She was evaluated in the context of the global COVID-19 pandemic, which necessitated consideration that the patient might be at risk for infection with the SARS-CoV-2 virus that causes COVID-19. Institutional protocols and algorithms that pertain to the evaluation of patients at risk for COVID-19 are in a state of rapid change based on information released by regulatory bodies including the CDC and federal and state organizations. These policies and algorithms were followed during the patient's care in the ED.  Patient with ED evaluation headache syndrome that began 2 days prior.  Patient describes onset while at work.  She also describes feeling fatigued, and overheated.  She  denies any syncope, chest pain, shortness of breath.  Patient clinical picture today is reassuring as vital signs are stable at this time.  She is had no interim complaints.  Her headache presentation with seems consistent with her premenstrual syndrome.  We discussed the possibility of further evaluation including labs, EKG, and CT scan imaging.  Patient declined any further investigation at this time.  She is overall reassured by her normal exam, and is reporting that she is stable at this time.  She also agrees that the headache now appears consistent with her headache pattern, and may have been aggravated by being overheated.  Patient is discharged at this time to follow with primary provider or return to the ED as needed.  A work note is provided return her to work as  scheduled. ____________________________________________  FINAL CLINICAL IMPRESSION(S) / ED DIAGNOSES  Final diagnoses:  Nonintractable episodic headache, unspecified headache type      Lissa HoardMenshew, Aloria Looper V Bacon, PA-C 03/24/19 1403    Shaune PollackIsaacs, [place], MD 03/26/19 2325

## 2019-03-24 NOTE — ED Notes (Signed)
See triage note   States she developed a sharp right temporal headache  Couple of days ago and she was light headed    States today she is still having some slight h/a to the right temporal area   Also feels fatiqued

## 2019-03-24 NOTE — ED Triage Notes (Signed)
Pt states she works in a [employer] and yesterday started having a right sided HA with dizziness and nausea. States she went home and took some IBU, states she still has the slight pain on the right side of head with fatigue today.

## 2019-03-24 NOTE — Discharge Instructions (Signed)
Your exam is normal today following your episode of premenstrual headache and heat exhaustion. You should take OTC Tylenol or Motrin as needed. Drink water and low-sugar sports drinks to prevent dehydration. Follow-up with your provider for ongoing symptoms.
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
## 2021-08-08 ENCOUNTER — Emergency Department: Payer: Medicaid Other

## 2021-08-08 ENCOUNTER — Other Ambulatory Visit: Payer: Self-pay

## 2021-08-08 ENCOUNTER — Emergency Department
Admission: EM | Admit: 2021-08-08 | Discharge: 2021-08-08 | Disposition: A | Discharge status: Home / Self Care | Payer: Medicaid Other | Attending: Emergency Medicine | Admitting: Emergency Medicine

## 2021-08-08 DIAGNOSIS — R2242 Localized swelling, mass and lump, left lower limb: Secondary | ICD-10-CM | POA: Diagnosis not present

## 2021-08-08 DIAGNOSIS — Z87891 Personal history of nicotine dependence: Secondary | ICD-10-CM | POA: Insufficient documentation

## 2021-08-08 DIAGNOSIS — R079 Chest pain, unspecified: Secondary | ICD-10-CM | POA: Diagnosis present

## 2021-08-08 DIAGNOSIS — R0789 Other chest pain: Secondary | ICD-10-CM | POA: Insufficient documentation

## 2021-08-08 LAB — CBC
HCT: 39.5 % (ref 36.0–46.0)
Hemoglobin: 12.8 g/dL (ref 12.0–15.0)
MCH: 31.3 pg (ref 26.0–34.0)
MCHC: 32.4 g/dL (ref 30.0–36.0)
MCV: 96.6 fL (ref 80.0–100.0)
Platelets: 313 10*3/uL (ref 150–400)
RBC: 4.09 MIL/uL (ref 3.87–5.11)
RDW: 12.3 % (ref 11.5–15.5)
WBC: 3.8 10*3/uL — ABNORMAL LOW (ref 4.0–10.5)
nRBC: 0 % (ref 0.0–0.2)

## 2021-08-08 LAB — BASIC METABOLIC PANEL
Anion gap: 5 (ref 5–15)
BUN: 13 mg/dL (ref 6–20)
CO2: 26 mmol/L (ref 22–32)
Calcium: 9 mg/dL (ref 8.9–10.3)
Chloride: 105 mmol/L (ref 98–111)
Creatinine, Ser: 0.74 mg/dL (ref 0.44–1.00)
GFR, Estimated: >60 mL/min (ref >60)
Glucose, Bld: 96 mg/dL (ref 70–99)
Potassium: 4 mmol/L (ref 3.5–5.1)
Sodium: 136 mmol/L (ref 135–145)

## 2021-08-08 LAB — TROPONIN I (HIGH SENSITIVITY)
Troponin I (High Sensitivity): <2 ng/L (ref <18)
Troponin I (High Sensitivity): <2 ng/L (ref <18)

## 2021-08-08 LAB — D-DIMER, QUANTITATIVE: D-Dimer, Quant: <0.27 ug/mL-FEU (ref 0.00–0.50)

## 2021-08-08 NOTE — ED Provider Notes (Signed)
[facility] Emergency Department Provider Note  ____________________________________________  Time seen: Approximately 1:30 PM  I have reviewed the triage vital signs and the nursing notes.   HISTORY  Chief Complaint Hypertension    HPI Jeanette Wilson is a 40 y.o. female with a past history of anxiety, depression, schizophrenia who comes ED complaining of left upper chest pain described as crampy for the past week.  States that it hurts with exhaling and deep breath as well.  Nonradiating.  Worse with arm movement.  No alleviating factors.  Constant.  No shortness of breath cough or fever.  Today at work due to the pain she started to feel lightheaded.  She did not pass out.  She reports that they checked her blood pressure at work and found it to be elevated and advised her to come to the emergency department for evaluation.  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 Authorizing Provider  acetaminophen (TYLENOL) 500 MG tablet Take 1,000 mg by mouth every 6 (six) hours as needed.    [provider]     Allergies Wheat bran   Family History  Problem Relation Age of Onset   Cancer  Mother    Hypertension Mother    Diabetes Mother    Asthma Sister    ADD / ADHD Brother    Schizophrenia Brother    Bipolar disorder Brother    Hypertension Brother    Cirrhosis Brother    Asthma Daughter    Diabetes Maternal Grandmother    Hypertension Maternal Grandmother    Diabetes Paternal Grandmother    Heart disease Paternal Grandmother    Heart disease Brother    Anesthesia problems Neg Hx    Hypotension Neg Hx    Malignant hyperthermia Neg Hx    Pseudochol deficiency Neg Hx     Social History Social History   Tobacco Use   Smoking status: Former    Packs/day: 0.25    Types: Cigarettes    Quit date: 09/03/2015    Years since quitting: 5.9   Smokeless tobacco: Never  Vaping Use   Vaping Use: Never used  Substance Use Topics   Alcohol use: Yes    Comment: socially   Drug use: Yes    Frequency: 1.0 times per week    Types: Marijuana    Review of Systems  Constitutional:   No fever or chills.  ENT:   No sore throat. No rhinorrhea. Cardiovascular: Positive chest pain as above without syncope. Respiratory:   No dyspnea or cough. Gastrointestinal:   Negative for abdominal pain, vomiting and diarrhea.  Musculoskeletal:  Negative for focal pain or swelling All other systems reviewed and are negative except as documented above in ROS and HPI.  ____________________________________________   PHYSICAL EXAM:  VITAL SIGNS: ED Triage Vitals  Enc Vitals Group     BP 08/08/21 0956 (!) 135/93     Pulse Rate 08/08/21 0956 94     Resp 08/08/21 0956 18     Temp 08/08/21 0956 98.1 F (36.7 C)     Temp Source 08/08/21 0956 Oral     SpO2 08/08/21 0956 98 %     Weight --      Height --      Head Circumference --      Peak Flow --      Pain Score 08/08/21 0955 6     Pain Loc --      Pain Edu? --      Excl. in [place]? --     Vital signs reviewed, nursing assessments reviewed.   Constitutional:   Alert and oriented. Non-toxic appearance. Eyes:   Conjunctivae are  normal. EOMI. PERRL. ENT      Head:   Normocephalic and atraumatic.      Nose:   Wearing a mask.      Mouth/Throat:   Wearing a mask.      Neck:   No meningismus. Full ROM. Hematological/Lymphatic/Immunilogical:   No cervical lymphadenopathy. Cardiovascular:   RRR. Symmetric bilateral radial and DP pulses.  No murmurs. Cap refill less than 2 seconds. Respiratory:   Normal respiratory effort without tachypnea/retractions. Breath sounds are clear and equal bilaterally. No wheezes/rales/rhonchi.  Genitourinary:   deferred Musculoskeletal:   Normal range of motion in all extremities. No joint effusions.  No lower extremity tenderness.  Slight increased calf circumference on left compared to right by about 1 cm. Neurologic:   Normal speech and language.  Motor grossly intact. No acute focal neurologic deficits are appreciated.    ____________________________________________    LABS (pertinent positives/negatives) (all labs ordered are listed, but only abnormal results are displayed) Labs Reviewed  CBC - Abnormal; Notable for the following components:      Result Value   WBC 3.8 (*)    All other components within normal limits  BASIC METABOLIC PANEL  D-DIMER, QUANTITATIVE  POC URINE PREG, ED  TROPONIN I (HIGH SENSITIVITY)  TROPONIN I (HIGH SENSITIVITY)   ____________________________________________   EKG  Interpreted by me  Date: 08/08/2021  Rate: 77  Rhythm: normal sinus rhythm  QRS Axis: normal  Intervals: normal  ST/T Wave abnormalities: normal  Conduction Disutrbances: none  Narrative Interpretation: unremarkable     ____________________________________________    RADIOLOGY  DG Chest 2 View  Result Date: 08/08/2021 CLINICAL DATA:  Chest pain EXAM: CHEST - 2 VIEW COMPARISON:  11/26/2011 FINDINGS: Normal heart size and mediastinal contours. No acute infiltrate or edema. No effusion or pneumothorax. No acute osseous findings. IMPRESSION: Negative chest.  Electronically Signed   By: Tiburcio Pea M.D.   On: 08/08/2021 10:31   US Venous Img Lower Unilateral Left  Result Date: 08/08/2021 CLINICAL DATA:  Left lower extremity pain and swelling. EXAM: Left LOWER EXTREMITY VENOUS DOPPLER ULTRASOUND TECHNIQUE: Gray-scale sonography with compression, as well as color and duplex ultrasound, were performed to evaluate the deep venous system(s) from the level of the common femoral vein through the popliteal and proximal calf veins. COMPARISON:  None. FINDINGS: VENOUS Normal compressibility of the common femoral, superficial femoral, and popliteal veins, as well as the visualized calf veins. Visualized portions  of profunda femoral vein and great saphenous vein unremarkable. No filling defects to suggest DVT on grayscale or color Doppler imaging. Doppler waveforms show normal direction of venous flow, normal respiratory plasticity and response to augmentation. Limited views of the contralateral common femoral vein are unremarkable. OTHER None. Limitations: none IMPRESSION: Negative. Electronically Signed   By: Lupita Raider M.D.   On: 08/08/2021 13:12    ____________________________________________   PROCEDURES Procedures  ____________________________________________  DIFFERENTIAL DIAGNOSIS   Chest wall strain, pneumonia, pneumothorax, pleural effusion, DVT/PE  CLINICAL IMPRESSION / ASSESSMENT AND PLAN / ED COURSE  Medications ordered in the ED: Medications - No data to display  Pertinent labs & imaging results that were available during my care of the patient were reviewed by me and considered in my medical decision making (see chart for details).  Jeanette Wilson was evaluated in Emergency Department on 08/08/2021 for the symptoms described in the history of present illness. She was evaluated in the context of the global COVID-19 pandemic, which necessitated consideration that the patient might be at risk for infection with the SARS-CoV-2 virus that  causes COVID-19. Institutional protocols and algorithms that pertain to the evaluation of patients at risk for COVID-19 are in a state of rapid change based on information released by regulatory bodies including the CDC and federal and state organizations. These policies and algorithms were followed during the patient's care in the ED.   Patient presents with chest pain for a week.  Most likely due to chest wall strain in the course of her work at an [employer].  However she does describe something of a pleuritic nature.  Vital signs are normal, no signs of right heart strain on EKG, will obtain D-dimer for further risk stratification.  Clinical Course as of 08/08/21 1404  Wed Aug 08, 2021  1401 Labs negative.  Serial troponin normal, D-dimer normal.  Vitals remained stable.  Will recommend heat therapy and NSAIDs for likely muscular chest wall pain from overuse, stable for discharge. [PS]    Clinical Course User Index [PS] Sharman Cheek, MD     ____________________________________________   FINAL CLINICAL IMPRESSION(S) / ED DIAGNOSES    Final diagnoses:  Chest wall pain     ED Discharge Orders     None       Portions of this note were generated with dragon dictation software. Dictation errors may occur despite best attempts at proofreading.    Sharman Cheek, MD 08/08/21 [phone]

## 2021-08-08 NOTE — Discharge Instructions (Signed)
Your EKG, chest x-ray, and lab tests are all normal today.  Please take anti-inflammatory medicine such as ibuprofen 600 mg every 6 hours and use a heating pad intermittently to control the pain.  I suspect your pain is due to muscle overuse, and gentle shoulder stretching may help.

## 2021-08-08 NOTE — ED Triage Notes (Addendum)
Pt comes with c/o hypertension. Pt states she was at work after she almost fainted. Pt states they checked her BP and it was elevated.  Pt states her left arm and breast had some pain and numbness.

## 2021-12-11 ENCOUNTER — Encounter: Payer: Self-pay | Admitting: Intensive Care

## 2021-12-11 ENCOUNTER — Emergency Department
Admission: EM | Admit: 2021-12-11 | Discharge: 2021-12-11 | Disposition: A | Discharge status: Home / Self Care | Payer: Medicaid Other | Attending: Emergency Medicine | Admitting: Emergency Medicine

## 2021-12-11 ENCOUNTER — Other Ambulatory Visit: Payer: Self-pay

## 2021-12-11 ENCOUNTER — Emergency Department: Payer: Medicaid Other

## 2021-12-11 DIAGNOSIS — R0789 Other chest pain: Secondary | ICD-10-CM

## 2021-12-11 DIAGNOSIS — B349 Viral infection, unspecified: Secondary | ICD-10-CM

## 2021-12-11 DIAGNOSIS — U071 COVID-19: Secondary | ICD-10-CM | POA: Insufficient documentation

## 2021-12-11 DIAGNOSIS — R059 Cough, unspecified: Secondary | ICD-10-CM | POA: Diagnosis present

## 2021-12-11 LAB — CBC
HCT: 37.3 % (ref 36.0–46.0)
Hemoglobin: 12.1 g/dL (ref 12.0–15.0)
MCH: 30.9 pg (ref 26.0–34.0)
MCHC: 32.4 g/dL (ref 30.0–36.0)
MCV: 95.2 fL (ref 80.0–100.0)
Platelets: 269 10*3/uL (ref 150–400)
RBC: 3.92 MIL/uL (ref 3.87–5.11)
RDW: 12.5 % (ref 11.5–15.5)
WBC: 6.4 10*3/uL (ref 4.0–10.5)
nRBC: 0 % (ref 0.0–0.2)

## 2021-12-11 LAB — BASIC METABOLIC PANEL
Anion gap: 6 (ref 5–15)
BUN: 15 mg/dL (ref 6–20)
CO2: 27 mmol/L (ref 22–32)
Calcium: 9 mg/dL (ref 8.9–10.3)
Chloride: 102 mmol/L (ref 98–111)
Creatinine, Ser: 0.8 mg/dL (ref 0.44–1.00)
GFR, Estimated: >60 mL/min (ref >60)
Glucose, Bld: 80 mg/dL (ref 70–99)
Potassium: 3.4 mmol/L — ABNORMAL LOW (ref 3.5–5.1)
Sodium: 135 mmol/L (ref 135–145)

## 2021-12-11 LAB — RESP PANEL BY RT-PCR (FLU A&B, COVID) ARPGX2
Influenza A by PCR: NEGATIVE
Influenza B by PCR: NEGATIVE
SARS Coronavirus 2 by RT PCR: POSITIVE — AB

## 2021-12-11 LAB — POC URINE PREG, ED: Preg Test, Ur: NEGATIVE

## 2021-12-11 LAB — TROPONIN I (HIGH SENSITIVITY): Troponin I (High Sensitivity): 4 ng/L (ref <18)

## 2021-12-11 MED ORDER — BENZONATATE 100 MG PO CAPS: 100.0000 mg | ORAL_CAPSULE | Freq: Three times a day (TID) | ORAL | 0 refills | Status: AC | PRN

## 2021-12-11 MED ORDER — IBUPROFEN 600 MG PO TABS: 600.0000 mg | ORAL_TABLET | Freq: Four times a day (QID) | ORAL | 0 refills | Status: AC | PRN

## 2021-12-11 MED ORDER — ONDANSETRON 4 MG PO TBDP: 4.0000 mg | ORAL_TABLET | Freq: Three times a day (TID) | ORAL | 0 refills | Status: AC | PRN

## 2021-12-11 MED ORDER — IBUPROFEN 600 MG PO TABS: 600.0000 mg | ORAL_TABLET | Freq: Four times a day (QID) | ORAL | 0 refills | Status: DC | PRN

## 2021-12-11 MED ORDER — BENZONATATE 100 MG PO CAPS: 100.0000 mg | ORAL_CAPSULE | Freq: Three times a day (TID) | ORAL | 0 refills | Status: DC | PRN

## 2021-12-11 MED ORDER — ONDANSETRON 4 MG PO TBDP: 4.0000 mg | ORAL_TABLET | Freq: Three times a day (TID) | ORAL | 0 refills | Status: DC | PRN

## 2021-12-11 NOTE — ED Provider Notes (Signed)
? ?[facility] ?Provider Note ? ? ? Event Date/Time  ? First MD Initiated Contact with Patient 12/11/21 1348   ?  (approximate) ? ? ?History  ? ?Chest Pain and Cough ? ? ?HPI ? ?CHERMAINE SCHNYDER is a 41 y.o. female  having chest pain and cough for 2 days. The chest pain happens when she is coughing. Mild SOB more from coughing. Mostly dry in nature but occasional green mucus.  She reports vomiting x1. She reports some sore throat, cough, sneezing. No legs swelling.  Denies any other risk factors for PE. ? ?Physical Exam  ? ?Triage Vital Signs: ?ED Triage Vitals  ?Enc Vitals Group  ?   BP 12/11/21 1316 115/73  ?   Pulse Rate 12/11/21 1316 84  ?   Resp 12/11/21 1316 18  ?   Temp 12/11/21 1316 99.3 ?F (37.4 ?C)  ?   Temp Source 12/11/21 1316 Oral  ?   SpO2 12/11/21 1316 100 %  ?   Weight 12/11/21 1214 130 lb (59 kg)  ?   Height 12/11/21 1214 5' 1.5" (1.562 m)  ?   Head Circumference --   ?   Peak Flow --   ?   Pain Score 12/11/21 1214 7  ?   Pain Loc --   ?   Pain Edu? --   ?   Excl. in [place]? --   ? ? ?Most recent vital signs: ?Vitals:  ? 12/11/21 1316  ?BP: 115/73  ?Pulse: 84  ?Resp: 18  ?Temp: 99.3 ?F (37.4 ?C)  ?SpO2: 100%  ? ? ? ?General: Awake, no distress.  ?CV:  Good peripheral perfusion.  Reproducible chest wall pain ?Resp:  Normal effort.  ?Abd:  No distention.  Soft and nontender ?Other:  Oropharynx is clear without any significant swelling or erythema or exudates.  Full range of motion of neck.  TMs are clear bilaterally ? ? ?ED Results / Procedures / Treatments  ? ?Labs ?(all labs ordered are listed, but only abnormal results are displayed) ?Labs Reviewed  ?BASIC METABOLIC PANEL - Abnormal; Notable for the following components:  ?    Result Value  ? Potassium 3.4 (*)   ? All other components within normal limits  ?CBC  ?POC URINE PREG, ED  ?TROPONIN I (HIGH SENSITIVITY)  ?TROPONIN I (HIGH SENSITIVITY)  ? ? ? ?EKG ? ?My interpretation of EKG: ? ?Sinus tachy rate of 102, no st elevation, no  twi, normal intervals  ? ?RADIOLOGY ?I have reviewed the xray personally and no pneumonia  ? ? ?MEDICATIONS ORDERED IN ED: ?Medications - No data to display ? ? ?IMPRESSION / MDM / ASSESSMENT AND PLAN / ED COURSE  ?I reviewed the triage vital signs and the nursing notes. ?             ?               ? ?Differential diagnosis includes, but is not limited to viral, covid, flu, strep suspect more likely viral in nature.  Patient would like to leave prior to COVID, flu test resulting.  Patient's blood work shows negative troponin symptoms been going on for greater than 3 hours.  Labs are all reassuring otherwise normal CBC and BMP with only slightly low potassium.  Chest x-ray without evidence of pneumonia patient's chest wall pain is reproducible on examination therefore doubt cardiac in nature ? ?I suspect most likely viral in nature.  Will treat symptomatically and have patient follow-up  with PCP if things are worsening. ? ? ?The patient is on the cardiac monitor to evaluate for evidence of arrhythmia and/or significant heart rate changes. ? ?FINAL CLINICAL IMPRESSION(S) / ED DIAGNOSES  ? ?Final diagnoses:  ?Viral illness  ?Musculoskeletal chest pain  ? ? ? ?Rx / DC Orders  ? ?ED Discharge Orders   ? ?      Ordered  ?  ondansetron (ZOFRAN-ODT) 4 MG disintegrating tablet  Every 8 hours PRN       ? 12/11/21 1416  ?  benzonatate (TESSALON PERLES) 100 MG capsule  3 times daily PRN       ? 12/11/21 1416  ?  ibuprofen (ADVIL) 600 MG tablet  Every 6 hours PRN       ? 12/11/21 1416  ? ?  ?  ? ?  ? ? ? ?Note:  This document was prepared using Dragon voice recognition software and may include unintentional dictation errors. ?  ?Concha Se, MD ?12/11/21 1451 ? ?

## 2021-12-11 NOTE — Discharge Instructions (Signed)
Suspect most likely viral in nature we are treating your symptoms with medications and she can also take Tylenol 1 g every 8 hours and try an Allegra or Zyrtec over-the-counter return to the ER if you develop worsening symptoms or any other concerns ?

## 2021-12-11 NOTE — ED Triage Notes (Signed)
Patient c/o headache, cp, cough, sore throat, and congestion.  ?

## 2021-12-12 ENCOUNTER — Telehealth: Payer: Self-pay | Admitting: Emergency Medicine

## 2021-12-12 MED ORDER — NIRMATRELVIR/RITONAVIR (PAXLOVID)TABLET: 3.0000 | ORAL_TABLET | Freq: Two times a day (BID) | ORAL | 0 refills | Status: AC

## 2021-12-12 NOTE — Telephone Encounter (Signed)
Patient's COVID test was positive.  Discussed with patient and get patient is unvaccinated so we discussed Paxlovid treatment and she would like to start given she is within the 5-day window this was prescribed and sent to her pharmacy.  GFR is within range. ?

## 2023-08-12 ENCOUNTER — Ambulatory Visit
Admission: EM | Admit: 2023-08-12 | Discharge: 2023-08-12 | Disposition: A | Discharge status: Home / Self Care | Payer: Medicaid Other | Attending: Emergency Medicine | Admitting: Emergency Medicine

## 2023-08-12 DIAGNOSIS — Z113 Encounter for screening for infections with a predominantly sexual mode of transmission: Secondary | ICD-10-CM | POA: Diagnosis not present

## 2023-08-12 DIAGNOSIS — N390 Urinary tract infection, site not specified: Secondary | ICD-10-CM | POA: Insufficient documentation

## 2023-08-12 LAB — URINALYSIS, W/ REFLEX TO CULTURE (INFECTION SUSPECTED)
Bilirubin Urine: NEGATIVE
Glucose, UA: NEGATIVE mg/dL
Ketones, ur: NEGATIVE mg/dL
Nitrite: POSITIVE — AB
Protein, ur: 30 mg/dL — AB
Specific Gravity, Urine: 1.025 (ref 1.005–1.030)
Squamous Epithelial / HPF: NONE SEEN /[HPF] (ref 0–5)
pH: 7 (ref 5.0–8.0)

## 2023-08-12 LAB — WET PREP, GENITAL
Clue Cells Wet Prep HPF POC: NONE SEEN
Sperm: NONE SEEN
Trich, Wet Prep: NONE SEEN
WBC, Wet Prep HPF POC: <10 (ref <10)
Yeast Wet Prep HPF POC: NONE SEEN

## 2023-08-12 MED ORDER — NITROFURANTOIN MONOHYD MACRO 100 MG PO CAPS: 100.0000 mg | ORAL_CAPSULE | Freq: Two times a day (BID) | ORAL | 0 refills | Status: AC

## 2023-08-12 MED ORDER — PHENAZOPYRIDINE HCL 200 MG PO TABS: 200.0000 mg | ORAL_TABLET | Freq: Three times a day (TID) | ORAL | 0 refills | Status: AC

## 2023-08-12 NOTE — ED Provider Notes (Signed)
[facility]    CSN: [id number] Arrival date & time: 08/12/23  0856      History   Chief Complaint Chief Complaint  Patient presents with   Vaginal Problem    HPI Jeanette Wilson is a 41 y.o. female.   HPI  42 year old female with a past medical history significant for schizophrenia, depression, and anxiety presents for evaluation of burning after urination along with urinary urgency and frequency.  She states that her urine has been cloudy for the last 3 days but she has not noticed any blood.  She also denies fever, nausea, vomiting, low back pain, vaginal discharge, vaginal itching, or vaginal odor.  She reports that she is in a monogamous relationship with a single partner but they are having unprotected sex.  She is concerned about possible STIs.  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 Authorizing Provider  nitrofurantoin, macrocrystal-monohydrate, (MACROBID) 100 MG capsule Take 1 capsule (100 mg total) by mouth 2 (two) times daily. 08/12/23  Yes Becky Augusta, NP  phenazopyridine (PYRIDIUM) 200 MG tablet Take 1 tablet (200 mg total) by mouth 3  (three) times daily. 08/12/23  Yes Becky Augusta, NP    Family History Family History  Problem Relation Age of Onset   Cancer Mother    Hypertension Mother    Diabetes Mother    Asthma Sister    ADD / ADHD Brother    Schizophrenia Brother    Bipolar disorder Brother    Hypertension Brother    Cirrhosis Brother    Asthma Daughter    Diabetes Maternal Grandmother    Hypertension Maternal Grandmother    Diabetes Paternal Grandmother    Heart disease Paternal Grandmother    Heart disease Brother    Anesthesia problems Neg Hx    Hypotension Neg Hx    Malignant hyperthermia Neg Hx    Pseudochol deficiency Neg Hx     Social History Social History   Tobacco Use   Smoking status: Former    Current packs/day: 0.00    Types: Cigarettes    Quit date: 09/03/2015    Years since quitting: 7.9   Smokeless tobacco: Never  Vaping Use   Vaping status: Never Used  Substance Use Topics   Alcohol use: Yes    Comment: socially   Drug  use: Yes    Frequency: 1.0 times per week    Types: Marijuana     Allergies   Wheat   Review of Systems Review of Systems  Constitutional:  Negative for fever.  Gastrointestinal:  Negative for abdominal pain, nausea and vomiting.  Genitourinary:  Positive for dysuria and frequency. Negative for hematuria, vaginal discharge and vaginal pain.  Musculoskeletal:  Negative for back pain.     Physical Exam Triage Vital Signs ED Triage Vitals  Encounter Vitals Group     BP      Systolic BP Percentile      Diastolic BP Percentile      Pulse      Resp      Temp      Temp src      SpO2      Weight      Height      Head Circumference      Peak Flow      Pain Score      Pain Loc      Pain Education      Exclude from Growth Chart    No data found.  Updated Vital Signs BP (!) 151/94 (BP Location: Left Arm)   Pulse 80   Temp 98.6 F (37 C) (Oral)   Resp 16   Ht 5\' 2"  (1.575 m)   Wt 150 lb (68 kg)   LMP 07/28/2023 (Exact Date)   SpO2  100%   BMI 27.44 kg/m   Visual Acuity Right Eye Distance:   Left Eye Distance:   Bilateral Distance:    Right Eye Near:   Left Eye Near:    Bilateral Near:     Physical Exam Vitals and nursing note reviewed.  Constitutional:      Appearance: Normal appearance. She is not ill-appearing.  HENT:     Head: Normocephalic and atraumatic.  Cardiovascular:     Rate and Rhythm: Normal rate and regular rhythm.     Pulses: Normal pulses.     Heart sounds: Normal heart sounds. No murmur heard.    No friction rub. No gallop.  Pulmonary:     Effort: Pulmonary effort is normal.     Breath sounds: Normal breath sounds. No wheezing, rhonchi or rales.  Abdominal:     General: Abdomen is flat.     Palpations: Abdomen is soft.     Tenderness: There is abdominal tenderness. There is no right CVA tenderness, left CVA tenderness, guarding or rebound.     Comments: Mild suprapubic tenderness without guarding or rebound.  Skin:    General: Skin is warm and dry.     Capillary Refill: Capillary refill takes less than 2 seconds.     Findings: No rash.  Neurological:     General: No focal deficit present.     Mental Status: She is alert and oriented to person, place, and time.      UC Treatments / Results  Labs (all labs ordered are listed, but only abnormal results are displayed) Labs Reviewed  URINALYSIS, W/ REFLEX TO CULTURE (INFECTION SUSPECTED) - Abnormal; Notable for the following components:      Result Value   APPearance HAZY (*)    Hgb urine dipstick SMALL (*)    Protein, ur 30 (*)    Nitrite POSITIVE (*)    Leukocytes,Ua LARGE (*)    Bacteria, UA MANY (*)    All other components within normal limits  WET PREP, GENITAL  URINE CULTURE  CERVICOVAGINAL ANCILLARY ONLY    EKG   Radiology No results found.  Procedures Procedures (including critical care time)  Medications Ordered in UC Medications - No data to display  Initial Impression / Assessment and Plan / UC  Course  I have reviewed the triage vital signs and the nursing notes.  Pertinent labs & imaging results that were available during my care of the patient were reviewed by me and considered in my medical decision making (see chart for details).   Patient is a nontoxic-appearing 42 year old female presenting for evaluation of genitourinary complaints as outlined HPI above.  She reports that she is concerned about STIs despite having a monogamous partner.  She has not had any vaginal discharge or itching but she does have burning at the end of urination along with urinary urgency and frequency and cloudy urination.  Physical exam does not reveal any CVA tenderness but does reveal mild suprapubic tenderness on palpation.  I will order a urinalysis to evaluate for the presence of UTI along with a wet prep to evaluate for possible bacterial vaginosis or yeast infection which could be causing the burning sensation.  Additionally, I will order a cervical vaginal cytology swab to evaluate the presence of gonorrhea or chlamydia given patient's concern.  Vaginal wet prep is negative for yeast, trichomonas, or clue cells.  Urinalysis has hazy appearance with small hemoglobin, 30 protein, nitrite positive and large leukocyte esterase.  Reflex microscopy shows 6-10 white cells, 0-5 red cells, and many bacteria.  I will order a urine culture.  I will discharge patient home with a diagnosis of urinary tract infection and start her on Macrobid 100 mg twice daily for 5 days along with Pyridium 200 mg every 8 hours as needed for urinary discomfort.  I have explained to her that if her STI testing comes back with any positive results that she will be contacted by phone and treatment options will be made available.  Otherwise, her results will appear in her MyChart.   Final Clinical Impressions(s) / UC Diagnoses   Final diagnoses:  Acute lower UTI (urinary tract infection)  Routine screening for STI (sexually  transmitted infection)     Discharge Instructions      Take the Macrobid twice daily for 5 days with food for treatment of urinary tract infection.  Use the Pyridium every 8 hours as needed for urinary discomfort.  This will turn your urine a bright red-orange.  Increase your oral fluid intake so that you increase your urine production and or flushing your urinary system.  Take an over-the-counter probiotic, such as Culturelle-Align-Activia, 1 hour after each dose of antibiotic to prevent diarrhea or yeast infections from forming.  We will culture urine and change the antibiotics if necessary.  If your testing for STIs comes back positive for any infection you will be contacted by phone and treatment options will be made available.  If your results are negative they will appear in your MyChart.  Return for reevaluation, or see your primary care provider, for any new or worsening symptoms.      ED Prescriptions     Medication Sig Dispense Auth. Provider   nitrofurantoin, macrocrystal-monohydrate, (MACROBID) 100 MG capsule Take 1 capsule (100 mg total) by mouth 2 (two) times daily. 10 capsule Becky Augusta, NP   phenazopyridine (PYRIDIUM) 200 MG tablet Take 1 tablet (200 mg total) by mouth 3 (three) times daily. 6 tablet Becky Augusta, NP      PDMP not reviewed this encounter.  Becky Augusta, NP 08/12/23 1011

## 2023-08-12 NOTE — Discharge Instructions (Addendum)
Take the Macrobid twice daily for 5 days with food for treatment of urinary tract infection.  Use the Pyridium every 8 hours as needed for urinary discomfort.  This will turn your urine a bright red-orange.  Increase your oral fluid intake so that you increase your urine production and or flushing your urinary system.  Take an over-the-counter probiotic, such as Culturelle-Align-Activia, 1 hour after each dose of antibiotic to prevent diarrhea or yeast infections from forming.  We will culture urine and change the antibiotics if necessary.  If your testing for STIs comes back positive for any infection you will be contacted by phone and treatment options will be made available.  If your results are negative they will appear in your MyChart.  Return for reevaluation, or see your primary care provider, for any new or worsening symptoms.

## 2023-08-12 NOTE — ED Triage Notes (Signed)
Pt c/o vaginal discomfort after urination & burning x3 days. Denies any hematuria. Concerned about STD.

## 2023-08-13 LAB — CERVICOVAGINAL ANCILLARY ONLY
Chlamydia: NEGATIVE
Comment: NEGATIVE
Comment: NEGATIVE
Comment: NORMAL
Neisseria Gonorrhea: NEGATIVE
Trichomonas: NEGATIVE

## 2023-08-15 LAB — URINE CULTURE
Culture: >=100000 — AB
Special Requests: NORMAL

## 2023-12-03 IMAGING — CR DG CHEST 2V
1 series · 2 of 2 positions shown · non-contrast
Comparison: 11/26/2011

CLINICAL DATA: Chest pain

EXAM:
CHEST - 2 VIEW

[Series 1: dg chest 2 view · 0.14mm/px · 2 of 2 slices shown]
[im 1/2]
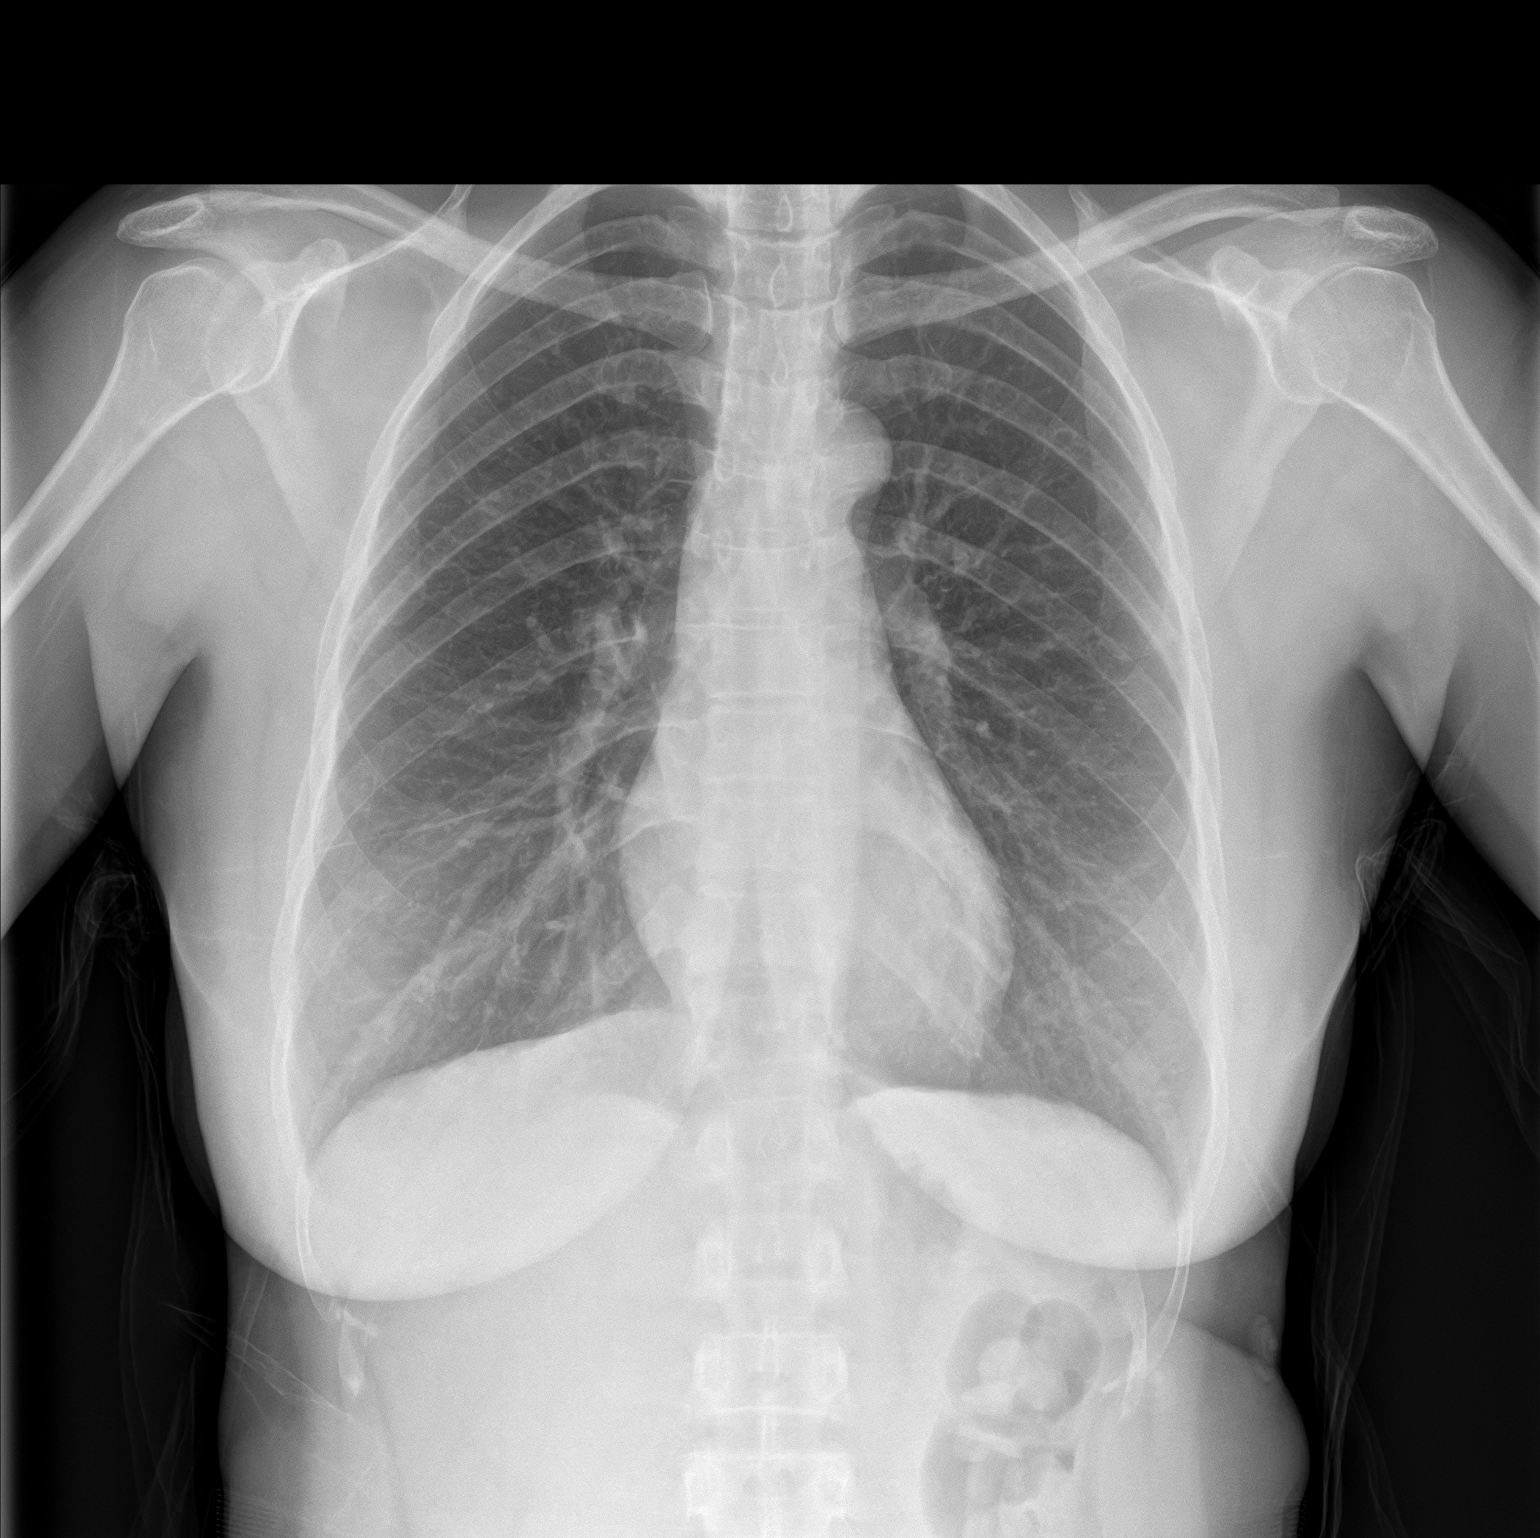
[im 2/2]
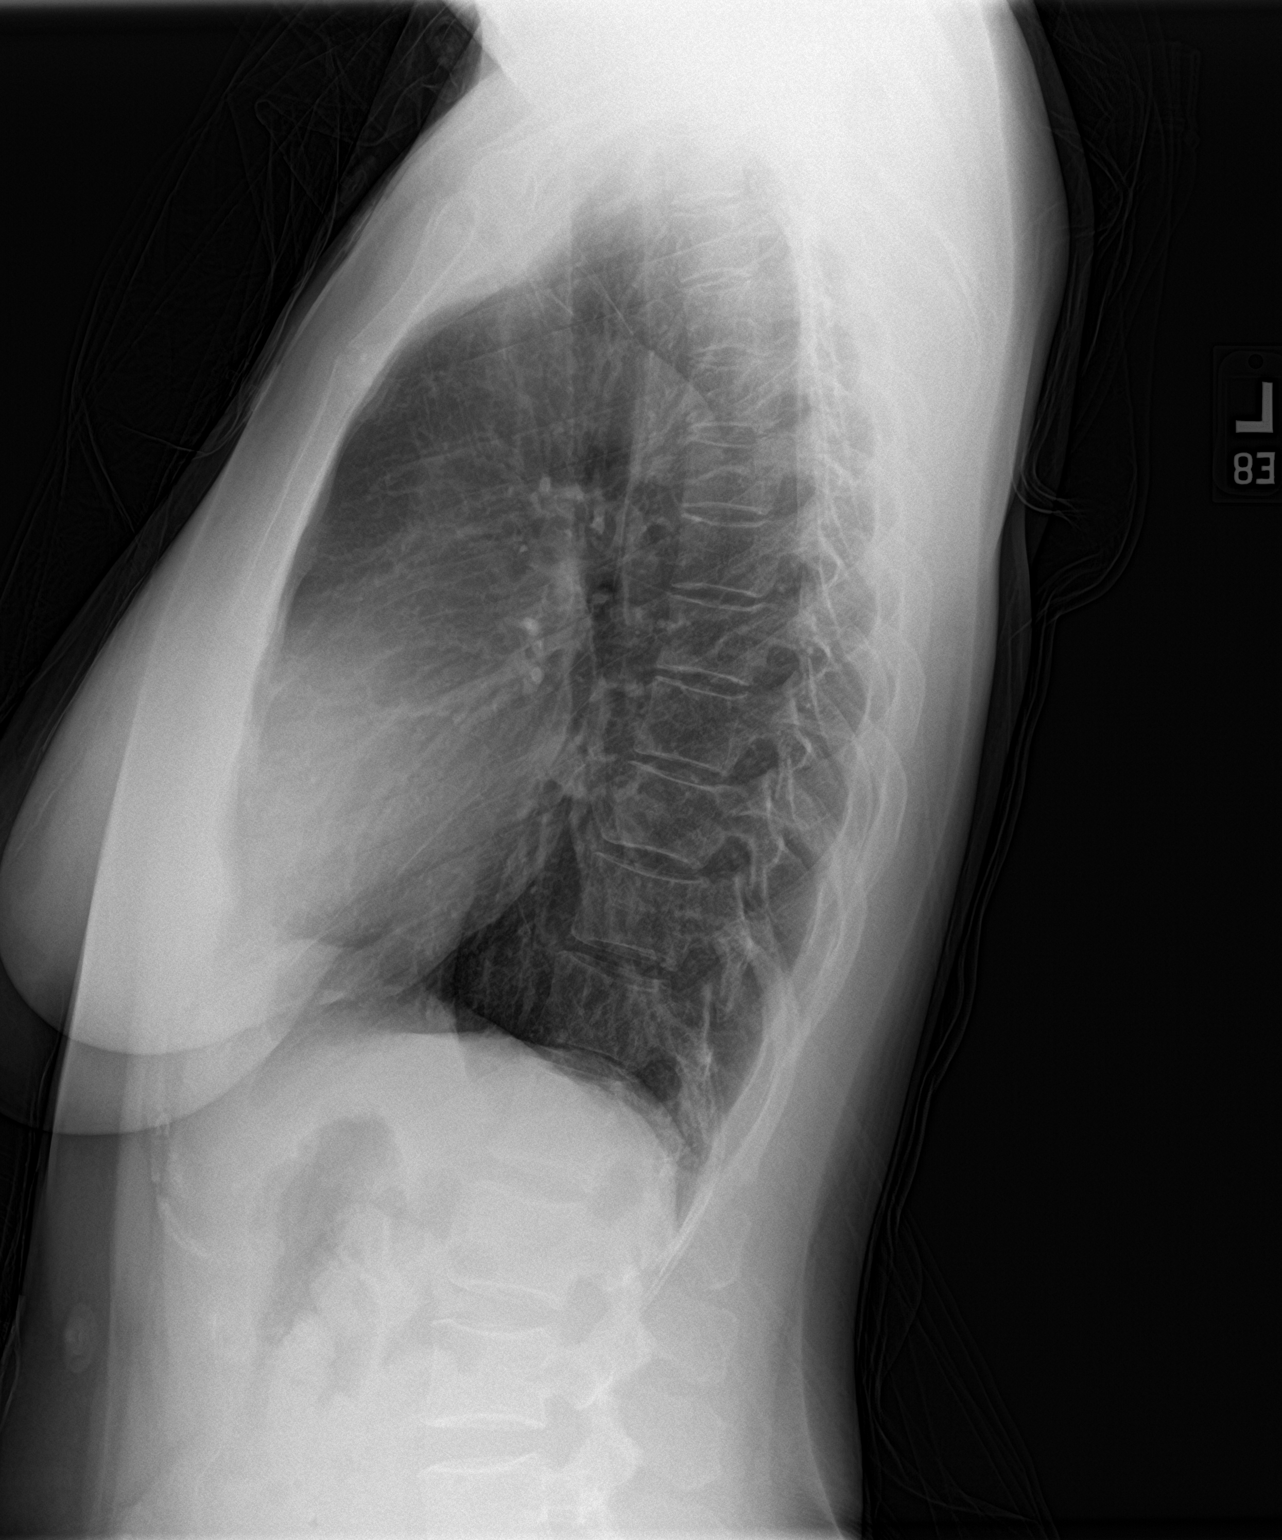

[2 of 2 positions shown; findings below may reference images not displayed]

FINDINGS: Normal heart size and mediastinal contours. No acute infiltrate or
edema. No effusion or pneumothorax. No acute osseous findings.
IMPRESSION: Negative chest.

## 2024-04-06 IMAGING — CR DG CHEST 2V
2 series · 2 of 2 positions shown · non-contrast
Comparison: None.

CLINICAL DATA: Chest pain

EXAM:
CHEST - 2 VIEW

[chest pa]
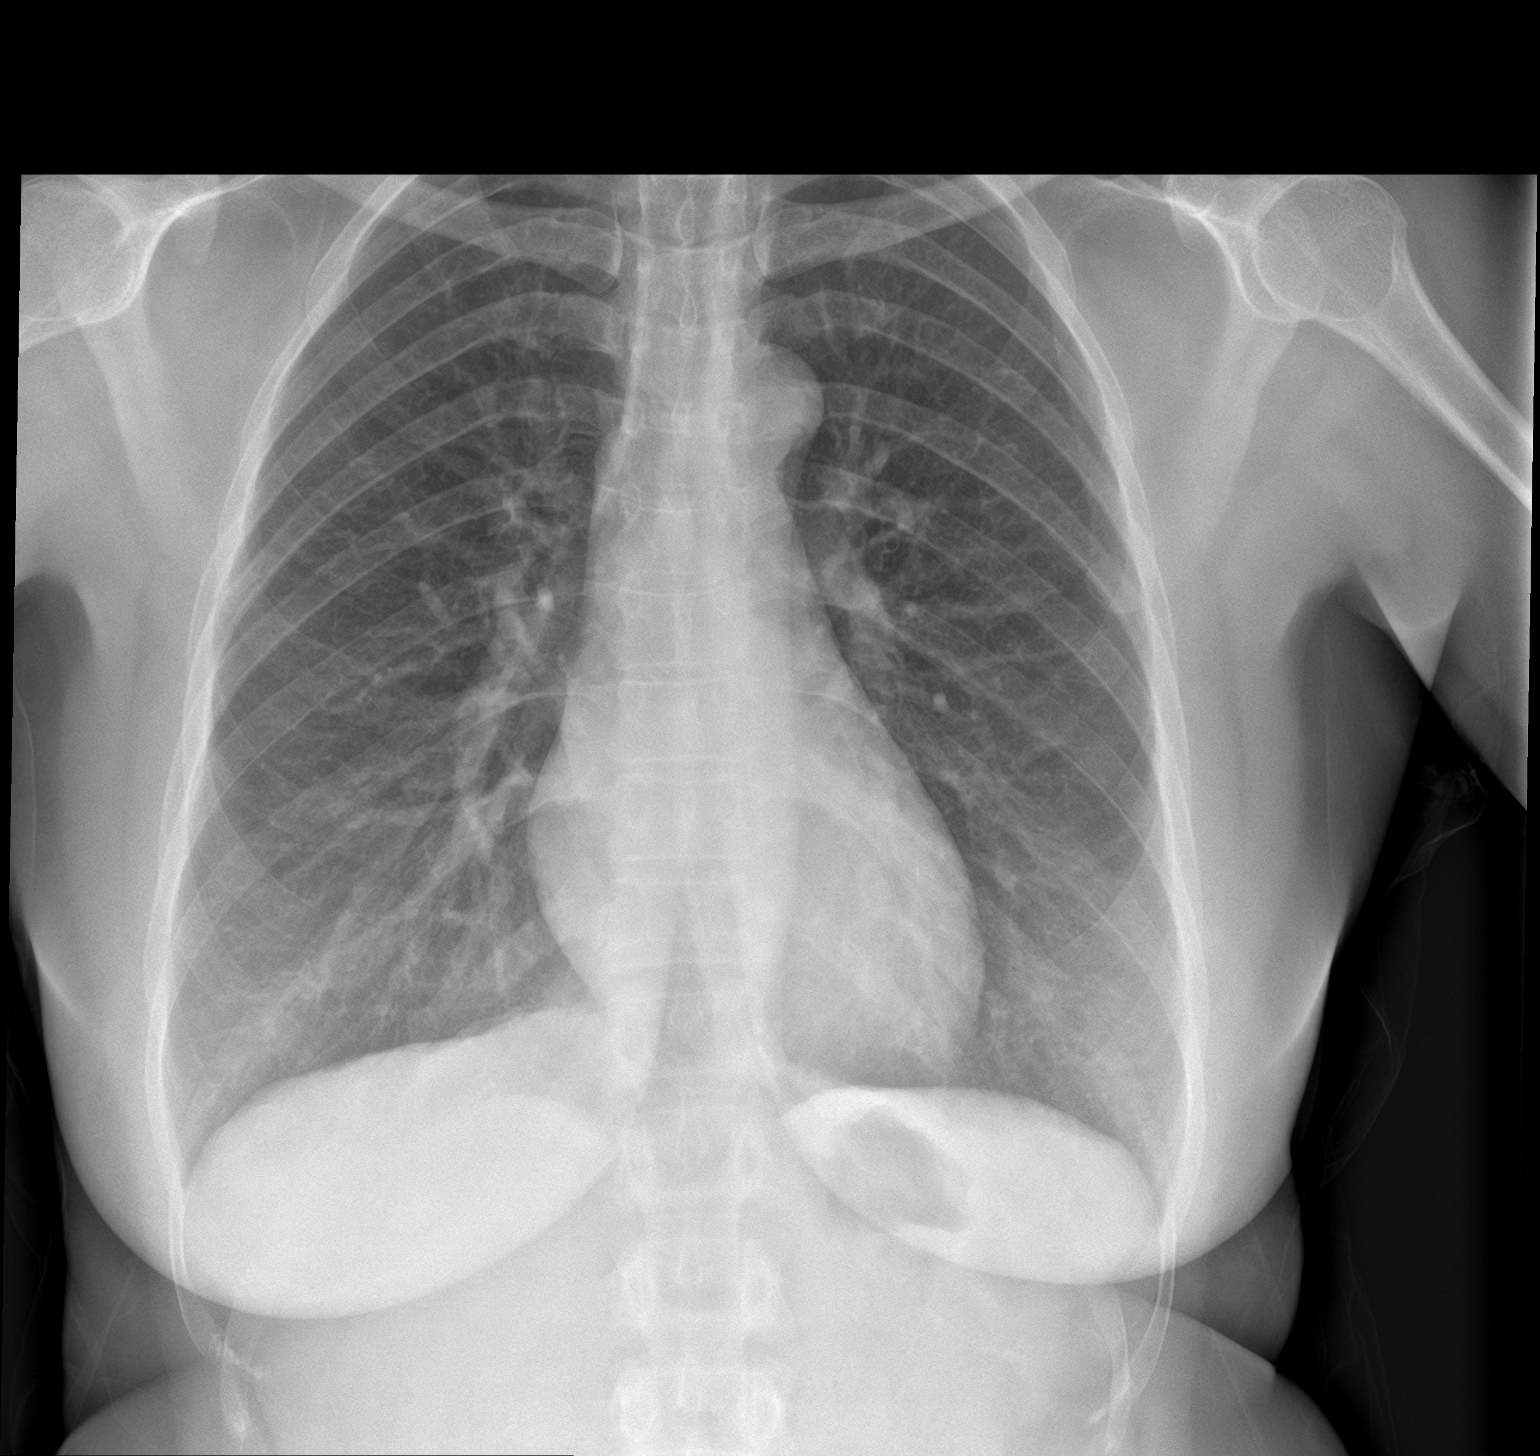

[chest lat]
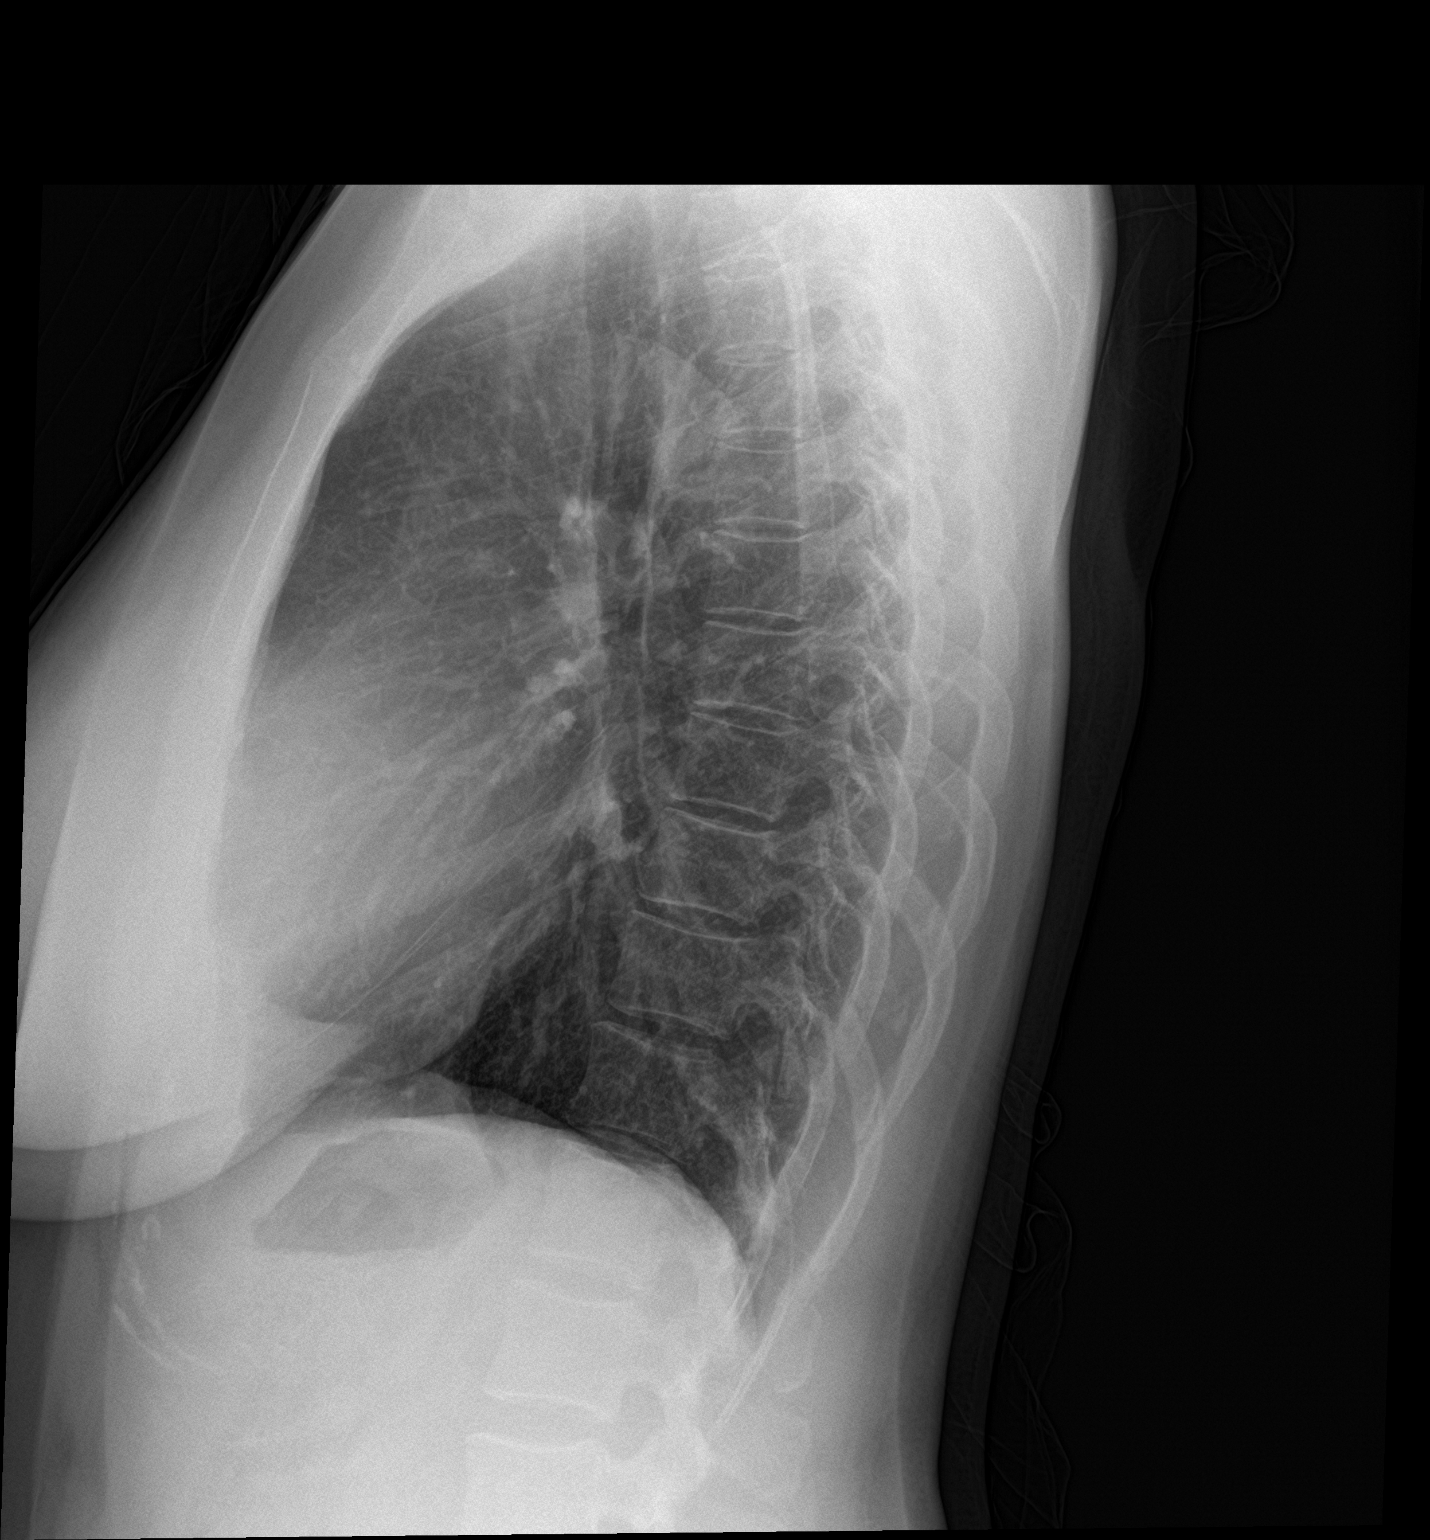

[2 of 2 positions shown; findings below may reference images not displayed]

FINDINGS: The heart size and mediastinal contours are within normal limits.
Both lungs are clear. No pleural effusion or pneumothorax. The
visualized skeletal structures are unremarkable.
IMPRESSION: No acute process in the chest.
